# Patient Record
Sex: Male | Born: 1960 | Race: White | Hispanic: No | Marital: Single | Smoking: Never smoker
Health system: Southern US, Community
[De-identification: ages and names within clinical notes are randomized; demographics above are authoritative.]

## PROBLEM LIST (undated history)

## (undated) DIAGNOSIS — M109 Gout, unspecified: Secondary | ICD-10-CM

## (undated) DIAGNOSIS — I10 Essential (primary) hypertension: Secondary | ICD-10-CM

## (undated) DIAGNOSIS — E785 Hyperlipidemia, unspecified: Secondary | ICD-10-CM

## (undated) DIAGNOSIS — E119 Type 2 diabetes mellitus without complications: Secondary | ICD-10-CM

## (undated) DIAGNOSIS — K649 Unspecified hemorrhoids: Secondary | ICD-10-CM

## (undated) HISTORY — DX: Type 2 diabetes mellitus without complications: E11.9

## (undated) HISTORY — DX: Essential (primary) hypertension: I10

## (undated) HISTORY — DX: Hyperlipidemia, unspecified: E78.5

## (undated) HISTORY — DX: Unspecified hemorrhoids: K64.9

## (undated) HISTORY — PX: HIP ARTHRODESIS W/ ILIAC CREST BONE GRAFT: SHX1748

## (undated) HISTORY — PX: HERNIA REPAIR: SHX51

## (undated) HISTORY — PX: KNEE ARTHROPLASTY: SHX992

---

## 2004-06-02 ENCOUNTER — Ambulatory Visit: Payer: Self-pay | Admitting: Family Medicine

## 2004-07-21 ENCOUNTER — Ambulatory Visit: Payer: Self-pay | Admitting: Family Medicine

## 2004-07-28 ENCOUNTER — Ambulatory Visit: Payer: Self-pay | Admitting: Family Medicine

## 2004-09-29 ENCOUNTER — Ambulatory Visit: Payer: Self-pay | Admitting: Family Medicine

## 2005-05-30 ENCOUNTER — Ambulatory Visit: Payer: Self-pay | Admitting: Family Medicine

## 2007-08-20 ENCOUNTER — Ambulatory Visit: Payer: Self-pay | Admitting: Family Medicine

## 2007-08-20 DIAGNOSIS — Z8719 Personal history of other diseases of the digestive system: Secondary | ICD-10-CM

## 2007-08-20 DIAGNOSIS — K439 Ventral hernia without obstruction or gangrene: Secondary | ICD-10-CM | POA: Insufficient documentation

## 2007-08-20 LAB — CONVERTED CEMR LAB
AST: 22 units/L (ref 0–37)
Basophils Absolute: 0 10*3/uL (ref 0.0–0.1)
Basophils Relative: 0.6 % (ref 0.0–1.0)
Chloride: 104 meq/L (ref 96–112)
Creatinine, Ser: 0.9 mg/dL (ref 0.4–1.5)
Eosinophils Absolute: 0.2 10*3/uL (ref 0.0–0.7)
GFR calc non Af Amer: 97 mL/min
MCHC: 34.3 g/dL (ref 30.0–36.0)
MCV: 84.4 fL (ref 78.0–100.0)
Neutrophils Relative %: 55.2 % (ref 43.0–77.0)
Platelets: 195 10*3/uL (ref 150–400)
RDW: 12.7 % (ref 11.5–14.6)
Sodium: 142 meq/L (ref 135–145)
TSH: 3.12 microintl units/mL (ref 0.35–5.50)
Total Bilirubin: 0.8 mg/dL (ref 0.3–1.2)

## 2007-08-21 ENCOUNTER — Telehealth: Payer: Self-pay | Admitting: Family Medicine

## 2007-09-03 ENCOUNTER — Ambulatory Visit: Payer: Self-pay | Admitting: Family Medicine

## 2007-09-03 DIAGNOSIS — I10 Essential (primary) hypertension: Secondary | ICD-10-CM

## 2007-10-01 ENCOUNTER — Ambulatory Visit: Payer: Self-pay | Admitting: Family Medicine

## 2007-10-21 ENCOUNTER — Ambulatory Visit (HOSPITAL_COMMUNITY): Admission: RE | Admit: 2007-10-21 | Discharge: 2007-10-21 | Payer: Self-pay | Admitting: General Surgery

## 2007-10-23 ENCOUNTER — Inpatient Hospital Stay (HOSPITAL_COMMUNITY): Admission: RE | Admit: 2007-10-23 | Discharge: 2007-10-24 | Payer: Self-pay | Admitting: General Surgery

## 2008-09-15 ENCOUNTER — Ambulatory Visit: Payer: Self-pay | Admitting: Family Medicine

## 2008-09-15 LAB — CONVERTED CEMR LAB
Albumin: 4 g/dL (ref 3.5–5.2)
Alkaline Phosphatase: 79 units/L (ref 39–117)
Basophils Absolute: 0 10*3/uL (ref 0.0–0.1)
Bilirubin, Direct: 0.1 mg/dL (ref 0.0–0.3)
Blood in Urine, dipstick: NEGATIVE
CO2: 30 meq/L (ref 19–32)
Calcium: 9.2 mg/dL (ref 8.4–10.5)
Creatinine, Ser: 1.2 mg/dL (ref 0.4–1.5)
Eosinophils Absolute: 0.2 10*3/uL (ref 0.0–0.7)
Glucose, Bld: 101 mg/dL — ABNORMAL HIGH (ref 70–99)
Glucose, Urine, Semiquant: NEGATIVE
HDL: 26 mg/dL — ABNORMAL LOW (ref >39.00)
Ketones, urine, test strip: NEGATIVE
Lymphocytes Relative: 28.4 % (ref 12.0–46.0)
MCHC: 33.4 g/dL (ref 30.0–36.0)
Neutrophils Relative %: 57.5 % (ref 43.0–77.0)
Nitrite: NEGATIVE
RDW: 14.3 % (ref 11.5–14.6)
Specific Gravity, Urine: 1.02
Triglycerides: 133 mg/dL (ref 0.0–149.0)
pH: 7

## 2008-09-23 ENCOUNTER — Telehealth: Payer: Self-pay | Admitting: Family Medicine

## 2008-10-29 ENCOUNTER — Ambulatory Visit: Payer: Self-pay | Admitting: Family Medicine

## 2008-10-29 DIAGNOSIS — E786 Lipoprotein deficiency: Secondary | ICD-10-CM | POA: Insufficient documentation

## 2009-07-15 ENCOUNTER — Ambulatory Visit: Payer: Self-pay | Admitting: Family Medicine

## 2010-03-22 NOTE — Assessment & Plan Note (Signed)
Summary: RECTAL BLEEDING (HEMMORHOIDS?) // RS/PT RSC/CJR   Vital Signs:  Patient profile:   50 year old male Weight:      251 pounds Temp:     98.5 degrees F oral BP sitting:   120 / 80  (left arm) Cuff size:   regular  Vitals Entered By: Kathrynn Speed CMA (Jul 15, 2009 3:26 PM) CC: Rectal Bleeding / Hemmorhoid, Hypertension Management   CC:  Rectal Bleeding / Hemmorhoid and Hypertension Management.  History of Present Illness: Dylan Estrada is a 50 year old, married man nonsmoker comes in today for evaluation of rectal bleeding.  He said a year's history of off-and-on rectal bleeding.  It occurs once or twice monthly.  He had a colonoscopy a year ago, which was normal.  Hypertension History:      Positive major cardiovascular risk factors include male age 49 years old or older or older, hyperlipidemia, and hypertension.  Negative major cardiovascular risk factors include non-tobacco-user status.     Current Medications (verified): 1)  Zestoretic 10-12.5 Mg  Tabs (Lisinopril-Hydrochlorothiazide) .... Take 1 Tablet By Mouth Every Morning  Allergies (verified): No Known Drug Allergies  Social History: Reviewed history from 08/20/2007 and no changes required. Occupation:  Never Smoked Alcohol use-yes Married  Review of Systems      See HPI  Physical Exam  General:  Well-developed,well-nourished,in no acute distress; alert,appropriate and cooperative throughout examination Abdomen:  Bowel sounds positive,abdomen soft and non-tender without masses, organomegaly or hernias noted. Rectal:  No external abnormalities noted. Normal sphincter tone. No rectal masses or tenderness.guaiac-negative   Impression & Recommendations:  Problem # 1:  RECTAL BLEEDING, HX OF (ICD-V12.79) Assessment Deteriorated  Orders: Prescription Created Electronically [phone])  Complete Medication List: 1)  Zestoretic 10-12.5 Mg Tabs (Lisinopril-hydrochlorothiazide) .... Take 1 tablet by mouth every morning 2)   Anusol-hc 25 Mg Supp (Hydrocortisone acetate) .Marland Kitchen.. 1 rectally at bedtime x 12 nites  Hypertension Assessment/Plan:      The patient's hypertensive risk group is category B: At least one risk factor (excluding diabetes) with no target organ damage.  His calculated 10 year risk of coronary heart disease is 9 %.  Today's blood pressure is 120/80.    Patient Instructions: 1)  take a stool softener on a daily basis, soaked for 10 minutes at that time, insert a medicated suppository in y rectum x 12 nites. if after that you see any bleeding call GI [phone] for further evaluation Prescriptions: ANUSOL-HC 25 MG SUPP (HYDROCORTISONE ACETATE) 1 rectally at bedtime x 12 nites  #12 x 0   Entered and Authorized by:   Roderick Pee MD   Signed by:   Roderick Pee MD on 07/15/2009   Method used:   Electronically to        [facility]  [address] (retail)       [address]       [place], [place]  [address]       Ph: [phone] or [phone]       Fax: [phone]   RxID:   [id number]

## 2010-06-01 ENCOUNTER — Other Ambulatory Visit: Payer: Self-pay | Admitting: Family Medicine

## 2010-07-05 NOTE — Op Note (Signed)
NAME:  Dylan Estrada, Dylan Estrada                 ACCOUNT NO.:  [id number]   MEDICAL RECORD NO.:  [id number]          PATIENT TYPE:  INP   LOCATION:  1535                         FACILITY[facility]  [facility]   PHYSICIAN:  Juanetta Gosling, MDDATE OF BIRTH:  May 17, 1960   DATE OF PROCEDURE:  10/23/2007  DATE OF DISCHARGE:                               OPERATIVE REPORT   PREOPERATIVE DIAGNOSIS:  Umbilical hernia with diastasis recti   POSTOPERATIVE DIAGNOSIS:  Umbilical hernia with diastasis recti   PROCEDURE:  Laparoscopic umbilical hernia repair with 4 x 6 inch Proceed  mesh.   SURGEON:  Juanetta Gosling, MD.   ASSISTANT:  Ollen Gross. Vernell Morgans, M.D.   ANESTHESIA:  General.   FINDINGS:  About a 3.5 cm umbilical hernia with a large diastasis made  it difficult to determine exactly the size of the hernia due to his  weakness of his abdominal wall.  No specimen.   ESTIMATED BLOOD LOSS:  Minimal.   COMPLICATIONS:  None.   DRAINS:  None.   DISPOSITION:  To PACU in stable condition.   INDICATIONS:  Mr. Guster is a 50 year old obese male who was referred  for discomfort in his abdomen with a bulge noted in his umbilicus as  well as extending superior to that.  He had a colonoscopy that he  reports as normal.  On exam he had a reducible approximately 3 cm  umbilical hernia with diastasis recti above that.  He and I had a long  conversation about the differences between a diastasis recti and  umbilical hernia.  We discussed involvement of weight gain and lack of  exercise as the contributory factors.  I have recommended ventral hernia  repair which I think best would be performed laparoscopically given his  size and the size of his defect.   PROCEDURE IN DETAIL:  After informed consent was obtained the patient  was first administered 1 gram of Ancef.  He was then taken to the  operating room where he had sequential compression devices placed on his  legs throughout the procedure.  He then underwent  general endotracheal  anesthesia without complication.  His abdomen was then prepped and  draped in a sterile surgical fashion.  A piece of Ioban was then laid  over his abdomen as well.  After his stomach was evacuated and a Foley  catheter had been inserted a 5 mm incision was then made in his left  upper quadrant and an OptiView trocar was used to enter into his abdomen  which was without difficulty.  His abdomen was then insufflated to 15  mmHg pressure.  I then placed another 10 mm port in his left mid abdomen  under direct vision without complication and another 5 mm port in his  left lower quadrant without complication.  He had some fat that was  unable to be reduced prior to his surgery that I removed from his  hernia.  He had approximately about a 3 cm hernia and a large diastasis  above that.  I used a 4 x 6 piece of Proceed mesh and  inserted that  through the 10 mm trocar after placing four Novofil sutures in the  cardinal positions on the mesh.  This mesh was then laid out in the  correct orientation.  A 4 cm overlap was obtained in all directions.  The inferior suture was first approached.  A stab incision was then made  and this suture was then brought up using the Storz suture passer.  The  Storz suture passer did hit some of his intra-abdominal fat when it was  going in due to his space and lax abdominal wall but no injury was noted  after investigation.  This stitch was then coupled both ends.  The  superior stitch was then approached where a stab wound was made and then  both ends were brought up through the abdominal wall and this was noted  to be taut and pulled up.  The stitch at the 9 o'clock position and the  3 o'clock position were approached in the same fashion after stab wounds  were made in these positions.  The mesh was then pulled up with the  stitches.  The mesh appeared to be in very good position.  The sutures  were tied down.  The protractor was then used  to secure all the edges of  the mesh to the peritoneal wall and upon completion of this the mesh was  seated in good position.  The 10 mm trocar was then removed.  An  Endoclose and a zero Vicryl was used to close this trocar site.  Upon  completion of this the remaining trocar was removed under direct vision.  The abdomen was then desufflated and the last trocar removed.  The  wounds were closed with a 4-0 Monocryl and the stab wounds and all the  wounds were then closed with Dermabond.  His Foley was removed at the  end of the operation.  He tolerated this procedure well, was extubated  in the operating room and will be transferred to the recovery room in  stable condition.      Juanetta Gosling, MD  Electronically Signed     MCW/MEDQ  D:  10/23/2007  T:  10/23/2007  Job:  [id number]

## 2010-10-01 ENCOUNTER — Telehealth: Payer: Self-pay | Admitting: Family Medicine

## 2011-01-16 ENCOUNTER — Other Ambulatory Visit: Payer: Self-pay | Admitting: Family Medicine

## 2011-04-18 ENCOUNTER — Ambulatory Visit (INDEPENDENT_AMBULATORY_CARE_PROVIDER_SITE_OTHER): Payer: BC Managed Care – PPO | Admitting: Family Medicine

## 2011-04-18 ENCOUNTER — Encounter: Payer: Self-pay | Admitting: Family Medicine

## 2011-04-18 VITALS — BP 140/90 | Temp 98.6°F | Ht 68.25 in | Wt 254.0 lb

## 2011-04-18 DIAGNOSIS — K439 Ventral hernia without obstruction or gangrene: Secondary | ICD-10-CM

## 2011-04-18 DIAGNOSIS — I10 Essential (primary) hypertension: Secondary | ICD-10-CM

## 2011-04-18 DIAGNOSIS — M109 Gout, unspecified: Secondary | ICD-10-CM

## 2011-04-18 DIAGNOSIS — Z0000 Encounter for general adult medical examination without abnormal findings: Secondary | ICD-10-CM

## 2011-04-18 LAB — BASIC METABOLIC PANEL
CO2: 27 mEq/L (ref 19–32)
GFR: 72.3 mL/min (ref >60.00)
Glucose, Bld: 97 mg/dL (ref 70–99)
Potassium: 4.7 mEq/L (ref 3.5–5.1)
Sodium: 137 mEq/L (ref 135–145)

## 2011-04-18 LAB — CBC WITH DIFFERENTIAL/PLATELET
Basophils Absolute: 0 10*3/uL (ref 0.0–0.1)
Basophils Relative: 0.4 % (ref 0.0–3.0)
Eosinophils Relative: 1.7 % (ref 0.0–5.0)
HCT: 39.9 % (ref 39.0–52.0)
Hemoglobin: 13.2 g/dL (ref 13.0–17.0)
Lymphs Abs: 2 10*3/uL (ref 0.7–4.0)
Monocytes Relative: 8.9 % (ref 3.0–12.0)
Neutro Abs: 7.8 10*3/uL — ABNORMAL HIGH (ref 1.4–7.7)
RBC: 4.8 Mil/uL (ref 4.22–5.81)
RDW: 14.1 % (ref 11.5–14.6)

## 2011-04-18 LAB — HEPATIC FUNCTION PANEL
ALT: 23 U/L (ref 0–53)
AST: 17 U/L (ref 0–37)
Albumin: 3.9 g/dL (ref 3.5–5.2)
Total Protein: 7.2 g/dL (ref 6.0–8.3)

## 2011-04-18 LAB — POCT URINALYSIS DIPSTICK
Bilirubin, UA: NEGATIVE
Glucose, UA: NEGATIVE
Ketones, UA: NEGATIVE
Leukocytes, UA: NEGATIVE
Spec Grav, UA: 1.02

## 2011-04-18 LAB — LDL CHOLESTEROL, DIRECT: Direct LDL: 161 mg/dL

## 2011-04-18 LAB — TSH: TSH: 2.63 u[IU]/mL (ref 0.35–5.50)

## 2011-04-18 MED ORDER — ALLOPURINOL 300 MG PO TABS: 300.0000 mg | ORAL_TABLET | Freq: Every day | ORAL | Status: DC

## 2011-04-18 MED ORDER — PREDNISONE 20 MG PO TABS: ORAL_TABLET | ORAL | Status: DC

## 2011-04-18 MED ORDER — HYDROCODONE-ACETAMINOPHEN 7.5-750 MG PO TABS: 1.0000 | ORAL_TABLET | Freq: Three times a day (TID) | ORAL | Status: AC | PRN

## 2011-04-18 MED ORDER — LISINOPRIL-HYDROCHLOROTHIAZIDE 10-12.5 MG PO TABS: 1.0000 | ORAL_TABLET | Freq: Every day | ORAL | Status: DC

## 2011-04-18 NOTE — Progress Notes (Signed)
  Subjective:    Patient ID: Dylan Estrada, male    DOB: February 08, 1961, 51 y.o.   MRN: [id number]  HPI Dylan Estrada is a 51 year old married male nonsmoker who comes in today for evaluation of hypertension  He currently takes Zestoretic 10-12.5 daily for hypertension BP 140/90  Weight is up to 254 pounds 68-1/4 inches tall  For the past week he's had intermittent episodes of swelling and pain and redness in his feet. Today he comes in limping. Father and mother both have a history of gout  Tetanus booster 2006    Review of Systems  Constitutional: Negative.   HENT: Negative.   Eyes: Negative.   Respiratory: Negative.   Cardiovascular: Negative.   Gastrointestinal: Negative.   Genitourinary: Negative.   Musculoskeletal: Negative.   Skin: Negative.   Neurological: Negative.   Hematological: Negative.   Psychiatric/Behavioral: Negative.        Objective:   Physical Exam  Constitutional: He is oriented to person, place, and time. He appears well-developed and well-nourished.  HENT:  Head: Normocephalic and atraumatic.  Right Ear: External ear normal.  Left Ear: External ear normal.  Nose: Nose normal.  Mouth/Throat: Oropharynx is clear and moist.  Eyes: Conjunctivae and EOM are normal. Pupils are equal, round, and reactive to light.  Neck: Normal range of motion. Neck supple. No JVD present. No tracheal deviation present. No thyromegaly present.  Cardiovascular: Normal rate, regular rhythm, normal heart sounds and intact distal pulses.  Exam reveals no gallop and no friction rub.   No murmur heard. Pulmonary/Chest: Effort normal and breath sounds normal. No stridor. No respiratory distress. He has no wheezes. He has no rales. He exhibits no tenderness.  Abdominal: Soft. Bowel sounds are normal. He exhibits no distension and no mass. There is no tenderness. There is no rebound and no guarding.  Genitourinary: Rectum normal, prostate normal and penis normal. Guaiac negative stool. No  penile tenderness.  Musculoskeletal: Normal range of motion. He exhibits no edema and no tenderness.       Swelling and redness both feet  Lymphadenopathy:    He has no cervical adenopathy.  Neurological: He is alert and oriented to person, place, and time. He has normal reflexes. No cranial nerve deficit. He exhibits normal muscle tone.  Skin: Skin is warm and dry. No rash noted. No erythema. No pallor.  Psychiatric: He has a normal mood and affect. His behavior is normal. Judgment and thought content normal.          Assessment & Plan:  Healthy male  Hypertension continue current medication  Obesity diet exercise weight loss  Gout start allopurinol prednisone for acute relief

## 2011-04-18 NOTE — Patient Instructions (Signed)
Beginning the prednisone as directed  Vicodin one half to one tablet at bedtime when necessary for severe pain  Begin allopurinol 1 daily for ever  Continue your blood pressure medicine daily  Return in 6 weeks for followup  Spring is here he is time for a diet and exercise program

## 2011-05-30 ENCOUNTER — Ambulatory Visit: Payer: BC Managed Care – PPO | Admitting: Family Medicine

## 2011-06-20 ENCOUNTER — Ambulatory Visit (INDEPENDENT_AMBULATORY_CARE_PROVIDER_SITE_OTHER): Payer: BC Managed Care – PPO | Admitting: Family Medicine

## 2011-06-20 ENCOUNTER — Encounter: Payer: Self-pay | Admitting: Family Medicine

## 2011-06-20 VITALS — BP 120/90 | Temp 98.2°F | Wt 260.0 lb

## 2011-06-20 DIAGNOSIS — I10 Essential (primary) hypertension: Secondary | ICD-10-CM

## 2011-06-20 NOTE — Patient Instructions (Signed)
Continue your current blood pressure medication one pill daily  Salt free diet  Walk 20 minutes daily  Return in February 2014 for your annual exam sooner if your blood pressure becomes elevated  Check your blood pressure weekly at home

## 2011-06-20 NOTE — Progress Notes (Signed)
  Subjective:    Patient ID: Dylan Estrada, male    DOB: 10-Nov-1960, 51 y.o.   MRN: [id number]  HPI Dylan Estrada is a 51 year old male who comes in today for evaluation of hypertension  We saw him this winter with the physical examination blood pressure was elevated he is on Zestoretic 10-12.5 daily BP down to 120/90. He states that now his mother is dying of metastatic lung cancer. His father died last fall   Review of Systems General and cardiovascular review of systems otherwise negative    Objective:   Physical Exam  Well-developed well-nourished overweight male in no acute distress BP 120/90 right arm sitting position weight 260 pounds hypertension close to goal continue current therapy followup in 10 months BP check weekly return if blood pressure becomes elevated  Obesity diet exercise and weight loss       Assessment & Plan:  Hypertension continue current medication BP weekly  Obesity diet exercise and weight loss return in one year sooner if any problems

## 2012-04-15 ENCOUNTER — Other Ambulatory Visit: Payer: BC Managed Care – PPO

## 2012-04-22 ENCOUNTER — Encounter: Payer: BC Managed Care – PPO | Admitting: Family Medicine

## 2012-05-27 ENCOUNTER — Other Ambulatory Visit: Payer: Self-pay | Admitting: Family Medicine

## 2012-05-31 ENCOUNTER — Other Ambulatory Visit: Payer: Self-pay | Admitting: Family Medicine

## 2012-06-18 ENCOUNTER — Other Ambulatory Visit (INDEPENDENT_AMBULATORY_CARE_PROVIDER_SITE_OTHER): Payer: Self-pay

## 2012-06-18 DIAGNOSIS — Z0000 Encounter for general adult medical examination without abnormal findings: Secondary | ICD-10-CM

## 2012-06-18 LAB — CBC WITH DIFFERENTIAL/PLATELET
Basophils Absolute: 0 10*3/uL (ref 0.0–0.1)
Eosinophils Relative: 2.9 % (ref 0.0–5.0)
HCT: 38.2 % — ABNORMAL LOW (ref 39.0–52.0)
Hemoglobin: 13.3 g/dL (ref 13.0–17.0)
Lymphocytes Relative: 28.9 % (ref 12.0–46.0)
Lymphs Abs: 2.2 10*3/uL (ref 0.7–4.0)
Monocytes Relative: 10 % (ref 3.0–12.0)
Neutro Abs: 4.5 10*3/uL (ref 1.4–7.7)
WBC: 7.8 10*3/uL (ref 4.5–10.5)

## 2012-06-18 LAB — BASIC METABOLIC PANEL
CO2: 28 mEq/L (ref 19–32)
Calcium: 9.1 mg/dL (ref 8.4–10.5)
Chloride: 104 mEq/L (ref 96–112)
Creatinine, Ser: 1 mg/dL (ref 0.4–1.5)
Glucose, Bld: 102 mg/dL — ABNORMAL HIGH (ref 70–99)
Sodium: 139 mEq/L (ref 135–145)

## 2012-06-18 LAB — HEPATIC FUNCTION PANEL
ALT: 23 U/L (ref 0–53)
AST: 18 U/L (ref 0–37)
Albumin: 3.8 g/dL (ref 3.5–5.2)
Total Protein: 6.9 g/dL (ref 6.0–8.3)

## 2012-06-18 LAB — POCT URINALYSIS DIPSTICK
Blood, UA: NEGATIVE
Leukocytes, UA: NEGATIVE
Nitrite, UA: NEGATIVE
Urobilinogen, UA: 1
pH, UA: 8

## 2012-06-18 LAB — LIPID PANEL
HDL: 29.7 mg/dL — ABNORMAL LOW (ref >39.00)
LDL Cholesterol: 117 mg/dL — ABNORMAL HIGH (ref 0–99)
Total CHOL/HDL Ratio: 6
Triglycerides: 129 mg/dL (ref 0.0–149.0)

## 2012-06-25 ENCOUNTER — Encounter: Payer: Self-pay | Admitting: Family Medicine

## 2012-06-25 ENCOUNTER — Ambulatory Visit (INDEPENDENT_AMBULATORY_CARE_PROVIDER_SITE_OTHER): Payer: PRIVATE HEALTH INSURANCE | Admitting: Family Medicine

## 2012-06-25 ENCOUNTER — Other Ambulatory Visit: Payer: Self-pay | Admitting: Family Medicine

## 2012-06-25 VITALS — BP 110/78 | Temp 98.7°F | Ht 68.0 in | Wt 252.0 lb

## 2012-06-25 DIAGNOSIS — M109 Gout, unspecified: Secondary | ICD-10-CM

## 2012-06-25 DIAGNOSIS — I10 Essential (primary) hypertension: Secondary | ICD-10-CM

## 2012-06-25 DIAGNOSIS — E663 Overweight: Secondary | ICD-10-CM

## 2012-06-25 MED ORDER — ALLOPURINOL 300 MG PO TABS: ORAL_TABLET | ORAL | Status: DC

## 2012-06-25 MED ORDER — LISINOPRIL-HYDROCHLOROTHIAZIDE 10-12.5 MG PO TABS: ORAL_TABLET | ORAL | Status: DC

## 2012-06-25 NOTE — Progress Notes (Signed)
  Subjective:    Patient ID: Dylan Estrada, male    DOB: Oct 09, 1960, 52 y.o.   MRN: [id number]  HPI  Dylan Estrada is a 52 year old married male nonsmoker who comes in today for evaluation of hypertension and gout  He takes Zestoretic 10 days 2.5 daily for hypertension BP 110/78  He takes allopurinol 300 mg daily to prevent gout  His weight is 252 pounds 62 inches tall. We discussed diet exercise and weight loss. He says he travels a lot he's gone through 4 days per week on the road  He had a colonoscopy 2 years ago for evaluation of bright red rectal bleeding. Colonoscopy normal. He had some internal hemorrhoids  Review of Systems  Constitutional: Negative.   HENT: Negative.   Eyes: Negative.   Respiratory: Negative.   Cardiovascular: Negative.   Gastrointestinal: Negative.   Genitourinary: Negative.   Musculoskeletal: Negative.   Skin: Negative.   Neurological: Negative.   Psychiatric/Behavioral: Negative.        Objective:   Physical Exam  Constitutional: He is oriented to person, place, and time. He appears well-developed and well-nourished.  HENT:  Head: Normocephalic and atraumatic.  Right Ear: External ear normal.  Left Ear: External ear normal.  Nose: Nose normal.  Mouth/Throat: Oropharynx is clear and moist.  Eyes: Conjunctivae and EOM are normal. Pupils are equal, round, and reactive to light.  Neck: Normal range of motion. Neck supple. No JVD present. No tracheal deviation present. No thyromegaly present.  Cardiovascular: Normal rate, regular rhythm, normal heart sounds and intact distal pulses.  Exam reveals no gallop and no friction rub.   No murmur heard. Pulmonary/Chest: Effort normal and breath sounds normal. No stridor. No respiratory distress. He has no wheezes. He has no rales. He exhibits no tenderness.  Abdominal: Soft. Bowel sounds are normal. He exhibits no distension and no mass. There is no tenderness. There is no rebound and no guarding.  Colonoscopy 2  years ago negative done before each 51 because of history rectal bleeding  Genitourinary: Rectum normal, prostate normal and penis normal. Guaiac negative stool. No penile tenderness.  Musculoskeletal: Normal range of motion. He exhibits no edema and no tenderness.  Lymphadenopathy:    He has no cervical adenopathy.  Neurological: He is alert and oriented to person, place, and time. He has normal reflexes. No cranial nerve deficit. He exhibits normal muscle tone.  Skin: Skin is warm and dry. No rash noted. No erythema. No pallor.  Psychiatric: He has a normal mood and affect. His behavior is normal. Judgment and thought content normal.          Assessment & Plan:  Healthy male  History of gout continue allopurinol one daily  Hypertension continue Zestoretic one daily at an aspirin tablet  Overweight will have him consult with the [facility] at [facility]

## 2012-06-25 NOTE — Patient Instructions (Addendum)
Continue the allopurinol and Zestoretic one of each daily  At an aspirin tablet daily  Up a consult request for you and your wife to go to the nutrition center to talk about nutrition and weight loss  Return in one year for general physical examination sooner if any problems

## 2012-12-01 ENCOUNTER — Emergency Department (HOSPITAL_COMMUNITY)
Admission: EM | Admit: 2012-12-01 | Discharge: 2012-12-01 | Disposition: A | Discharge status: Home / Self Care | Payer: PRIVATE HEALTH INSURANCE | Source: Home / Self Care | Attending: Family Medicine | Admitting: Family Medicine

## 2012-12-01 ENCOUNTER — Encounter (HOSPITAL_COMMUNITY): Payer: Self-pay | Admitting: Emergency Medicine

## 2012-12-01 ENCOUNTER — Emergency Department (INDEPENDENT_AMBULATORY_CARE_PROVIDER_SITE_OTHER): Payer: PRIVATE HEALTH INSURANCE

## 2012-12-01 DIAGNOSIS — M659 Synovitis and tenosynovitis, unspecified: Secondary | ICD-10-CM

## 2012-12-01 DIAGNOSIS — M7750 Other enthesopathy of unspecified foot: Secondary | ICD-10-CM

## 2012-12-01 HISTORY — DX: Gout, unspecified: M10.9

## 2012-12-01 MED ORDER — NAPROXEN 500 MG PO TABS: 500.0000 mg | ORAL_TABLET | Freq: Two times a day (BID) | ORAL | Status: DC

## 2012-12-01 NOTE — ED Provider Notes (Signed)
CSN: [id number]     Arrival date & time 12/01/12  1314 History   First MD Initiated Contact with Patient 12/01/12 1509     Chief Complaint  Patient presents with  . Foot Pain   (Consider location/radiation/quality/duration/timing/severity/associated sxs/prior Treatment) HPI Comments: Pt denies injury  Patient is a 52 y.o. male presenting with lower extremity pain. The history is provided by the patient.  Foot Pain This is a new problem. Episode onset: 3 weeks ago. The problem occurs constantly. The problem has not changed since onset.The symptoms are aggravated by walking and standing. The symptoms are relieved by rest. He has tried acetaminophen for the symptoms. The treatment provided no relief.    Past Medical History  Diagnosis Date  . Hyperlipidemia   . Hypertension   . Gout    Past Surgical History  Procedure Laterality Date  . Hip arthrodesis w/ iliac crest bone graft     Family History  Problem Relation Age of Onset  . Heart disease Other     male  . Diabetes Other   . Hyperlipidemia Other   . Hypertension Other    History  Substance Use Topics  . Smoking status: Never Smoker   . Smokeless tobacco: Not on file  . Alcohol Use: No    Review of Systems  Constitutional: Negative for fever and chills.  Musculoskeletal:       Foot pain  Skin: Negative for color change and wound.    Allergies  Review of patient's allergies indicates not on file.  Home Medications   Current Outpatient Rx  Name  Route  Sig  Dispense  Refill  . allopurinol (ZYLOPRIM) 300 MG tablet      TAKE ONE TABLET BY MOUTH DAILY   100 tablet   3   . lisinopril-hydrochlorothiazide (PRINZIDE,ZESTORETIC) 10-12.5 MG per tablet      TAKE ONE TABLET BY MOUTH EVERY DAY   100 tablet   3   . naproxen (NAPROSYN) 500 MG tablet   Oral   Take 1 tablet (500 mg total) by mouth 2 (two) times daily.   20 tablet   0    BP 126/76  Pulse 58  Temp(Src) 98.4 F (36.9 C) (Oral)  Resp 16  SpO2  98% Physical Exam  Constitutional: He appears well-developed and well-nourished. No distress.  Musculoskeletal:       Right foot: He exhibits tenderness, bony tenderness and swelling. He exhibits normal range of motion.       Feet:  Skin: Skin is warm, dry and intact. No erythema.    ED Course  Procedures (including critical care time) Labs Review Labs Reviewed - No data to display Imaging Review Dg Foot Complete Right  12/01/2012   CLINICAL DATA:  Right foot pain.  EXAM: RIGHT FOOT COMPLETE - 3+ VIEW  COMPARISON:  None.  FINDINGS: There is no evidence of fracture or dislocation. There is no evidence of arthropathy or other focal bone abnormality. Soft tissues are unremarkable.  IMPRESSION: Normal right foot.   Electronically Signed   By: Roque Lias M.D.   On: 12/01/2012 14:36    EKG Interpretation     Ventricular Rate:    PR Interval:    QRS Duration:   QT Interval:    QTC Calculation:   R Axis:     Text Interpretation:              MDM   1. Tendonitis of foot    Given post op shoe.  Rx naproxen 500mg  BID #20. Pt to f/u with podiatrist.     Cathlyn Parsons, NP 12/01/12 1515

## 2012-12-01 NOTE — ED Notes (Signed)
Pt reports he has right foot pain for about 3 weeks. Has been taking aspirin to alleviate pain. Feels pain mostly when he walks and puts pressure on that area. Pt is alert and oriented in no acute distress.

## 2012-12-03 NOTE — ED Provider Notes (Signed)
Medical screening examination/treatment/procedure(s) were performed by resident physician or non-physician practitioner and as supervising physician I was immediately available for consultation/collaboration.   Atalia Litzinger DOUGLAS MD.   Dillie Burandt D Akiva Josey, MD 12/03/12 1412 

## 2013-08-02 ENCOUNTER — Other Ambulatory Visit: Payer: Self-pay | Admitting: Family Medicine

## 2013-08-27 ENCOUNTER — Other Ambulatory Visit: Payer: Self-pay | Admitting: Family Medicine

## 2013-08-30 ENCOUNTER — Other Ambulatory Visit: Payer: Self-pay | Admitting: Family Medicine

## 2013-09-01 ENCOUNTER — Other Ambulatory Visit: Payer: Self-pay | Admitting: Family Medicine

## 2013-12-18 ENCOUNTER — Other Ambulatory Visit (INDEPENDENT_AMBULATORY_CARE_PROVIDER_SITE_OTHER): Payer: PRIVATE HEALTH INSURANCE

## 2013-12-18 DIAGNOSIS — E785 Hyperlipidemia, unspecified: Secondary | ICD-10-CM

## 2013-12-18 DIAGNOSIS — Z0000 Encounter for general adult medical examination without abnormal findings: Secondary | ICD-10-CM

## 2013-12-18 LAB — HEPATIC FUNCTION PANEL
ALBUMIN: 3.5 g/dL (ref 3.5–5.2)
ALT: 33 U/L (ref 0–53)
AST: 23 U/L (ref 0–37)
Alkaline Phosphatase: 84 U/L (ref 39–117)
Bilirubin, Direct: 0.1 mg/dL (ref 0.0–0.3)
TOTAL PROTEIN: 7.3 g/dL (ref 6.0–8.3)
Total Bilirubin: 0.7 mg/dL (ref 0.2–1.2)

## 2013-12-18 LAB — LDL CHOLESTEROL, DIRECT: LDL DIRECT: 129.2 mg/dL

## 2013-12-18 LAB — BASIC METABOLIC PANEL
BUN: 14 mg/dL (ref 6–23)
CALCIUM: 9.4 mg/dL (ref 8.4–10.5)
CO2: 29 meq/L (ref 19–32)
CREATININE: 1 mg/dL (ref 0.4–1.5)
Chloride: 102 mEq/L (ref 96–112)
GFR: 83.21 mL/min (ref >60.00)
Glucose, Bld: 108 mg/dL — ABNORMAL HIGH (ref 70–99)
Potassium: 4.7 mEq/L (ref 3.5–5.1)
Sodium: 137 mEq/L (ref 135–145)

## 2013-12-18 LAB — CBC WITH DIFFERENTIAL/PLATELET
BASOS ABS: 0 10*3/uL (ref 0.0–0.1)
BASOS PCT: 0.2 % (ref 0.0–3.0)
EOS ABS: 0.3 10*3/uL (ref 0.0–0.7)
Eosinophils Relative: 3.3 % (ref 0.0–5.0)
HCT: 42.4 % (ref 39.0–52.0)
Hemoglobin: 14 g/dL (ref 13.0–17.0)
LYMPHS PCT: 28.7 % (ref 12.0–46.0)
Lymphs Abs: 2.3 10*3/uL (ref 0.7–4.0)
MCHC: 33.1 g/dL (ref 30.0–36.0)
MCV: 89 fl (ref 78.0–100.0)
MONO ABS: 0.9 10*3/uL (ref 0.1–1.0)
Monocytes Relative: 10.9 % (ref 3.0–12.0)
NEUTROS PCT: 56.9 % (ref 43.0–77.0)
Neutro Abs: 4.5 10*3/uL (ref 1.4–7.7)
Platelets: 230 10*3/uL (ref 150.0–400.0)
RBC: 4.76 Mil/uL (ref 4.22–5.81)
RDW: 14.4 % (ref 11.5–15.5)
WBC: 7.9 10*3/uL (ref 4.0–10.5)

## 2013-12-18 LAB — LIPID PANEL
Cholesterol: 193 mg/dL (ref 0–200)
HDL: 30.7 mg/dL — AB (ref >39.00)
NONHDL: 162.3
Total CHOL/HDL Ratio: 6
Triglycerides: 236 mg/dL — ABNORMAL HIGH (ref 0.0–149.0)
VLDL: 47.2 mg/dL — ABNORMAL HIGH (ref 0.0–40.0)

## 2013-12-18 LAB — POCT URINALYSIS DIPSTICK
Bilirubin, UA: NEGATIVE
Glucose, UA: NEGATIVE
Ketones, UA: NEGATIVE
Leukocytes, UA: NEGATIVE
NITRITE UA: NEGATIVE
PH UA: 7
RBC UA: NEGATIVE
Spec Grav, UA: 1.015
Urobilinogen, UA: 1

## 2013-12-18 LAB — TSH: TSH: 3.43 u[IU]/mL (ref 0.35–4.50)

## 2013-12-18 LAB — PSA: PSA: 0.42 ng/mL (ref 0.10–4.00)

## 2013-12-23 ENCOUNTER — Encounter: Payer: Self-pay | Admitting: Family Medicine

## 2013-12-23 ENCOUNTER — Ambulatory Visit (INDEPENDENT_AMBULATORY_CARE_PROVIDER_SITE_OTHER): Payer: PRIVATE HEALTH INSURANCE | Admitting: Family Medicine

## 2013-12-23 VITALS — BP 120/80 | Temp 98.7°F | Ht 68.0 in | Wt 259.0 lb

## 2013-12-23 DIAGNOSIS — M10071 Idiopathic gout, right ankle and foot: Secondary | ICD-10-CM

## 2013-12-23 DIAGNOSIS — I10 Essential (primary) hypertension: Secondary | ICD-10-CM

## 2013-12-23 MED ORDER — ALLOPURINOL 300 MG PO TABS: ORAL_TABLET | ORAL | Status: DC

## 2013-12-23 MED ORDER — LISINOPRIL-HYDROCHLOROTHIAZIDE 10-12.5 MG PO TABS: ORAL_TABLET | ORAL | Status: DC

## 2013-12-23 NOTE — Patient Instructions (Signed)
Continue your current medications  I would strongly suggest you join  weight watchers

## 2013-12-23 NOTE — Progress Notes (Signed)
Pre visit review using our clinic review tool, if applicable. No additional management support is needed unless otherwise documented below in the visit note. 

## 2013-12-23 NOTE — Progress Notes (Signed)
   Subjective:    Patient ID: Dylan Estrada, male    DOB: 10-31-1960, 53 y.o.   MRN: [id number]  HPI Dylan Estrada is a 53 year old male,,,,,,,, I first saw him 41 years ago,,,,,, who comes in today for general physical examination because of a history of hypertension and gout  He takes lisinopril 10-12 0.5 daily for hypertension BP 120/80  He takes allopurinol 3 mg daily to prevent gout  Weight is up to 259 pounds height 68 inches  He gets routine eye care, dental care, first colonoscopy at age 28 years ago normal  Vaccinations updated by Apolonio Schneiders   Review of Systems  Constitutional: Negative.   HENT: Negative.   Eyes: Negative.   Respiratory: Negative.   Cardiovascular: Negative.   Gastrointestinal: Negative.   Endocrine: Negative.   Genitourinary: Negative.   Musculoskeletal: Negative.   Skin: Negative.   Allergic/Immunologic: Negative.   Neurological: Negative.   Hematological: Negative.   Psychiatric/Behavioral: Negative.        Objective:   Physical Exam  Constitutional: He is oriented to person, place, and time. He appears well-developed and well-nourished.  HENT:  Head: Normocephalic and atraumatic.  Right Ear: External ear normal.  Left Ear: External ear normal.  Nose: Nose normal.  Mouth/Throat: Oropharynx is clear and moist.  Eyes: Conjunctivae and EOM are normal. Pupils are equal, round, and reactive to light.  Neck: Normal range of motion. Neck supple. No JVD present. No tracheal deviation present. No thyromegaly present.  Cardiovascular: Normal rate, regular rhythm, normal heart sounds and intact distal pulses.  Exam reveals no gallop and no friction rub.   No murmur heard. No carotid nor aortic bruits or for pulses 2+ and symmetrical  Pulmonary/Chest: Effort normal and breath sounds normal. No stridor. No respiratory distress. He has no wheezes. He has no rales. He exhibits no tenderness.  Abdominal: Soft. Bowel sounds are normal. He exhibits no distension and no  mass. There is no tenderness. There is no rebound and no guarding.  Massive panniculus and ventral hernia  Genitourinary: Rectum normal, prostate normal and penis normal. Guaiac negative stool. No penile tenderness.  Musculoskeletal: Normal range of motion. He exhibits no edema or tenderness.  Lymphadenopathy:    He has no cervical adenopathy.  Neurological: He is alert and oriented to person, place, and time. He has normal reflexes. No cranial nerve deficit. He exhibits normal muscle tone.  Skin: Skin is warm and dry. No rash noted. No erythema. No pallor.  Psychiatric: He has a normal mood and affect. His behavior is normal. Judgment and thought content normal.          Assessment & Plan:  Hypertension ago continue current therapy  Gout asymptomatic now. All 300 mg daily continue that  Obesity weight 259 pounds recommend Weight Watchers

## 2013-12-24 ENCOUNTER — Telehealth: Payer: Self-pay | Admitting: Family Medicine

## 2013-12-24 NOTE — Telephone Encounter (Signed)
emmi mailed  °

## 2014-12-07 ENCOUNTER — Encounter: Payer: Self-pay | Admitting: Family Medicine

## 2014-12-07 ENCOUNTER — Ambulatory Visit (INDEPENDENT_AMBULATORY_CARE_PROVIDER_SITE_OTHER): Payer: PRIVATE HEALTH INSURANCE | Admitting: Family Medicine

## 2014-12-07 VITALS — BP 110/80 | Temp 98.2°F | Wt 257.0 lb

## 2014-12-07 DIAGNOSIS — E669 Obesity, unspecified: Secondary | ICD-10-CM | POA: Diagnosis not present

## 2014-12-07 DIAGNOSIS — I10 Essential (primary) hypertension: Secondary | ICD-10-CM | POA: Diagnosis not present

## 2014-12-07 DIAGNOSIS — Z23 Encounter for immunization: Secondary | ICD-10-CM | POA: Diagnosis not present

## 2014-12-07 DIAGNOSIS — M10071 Idiopathic gout, right ankle and foot: Secondary | ICD-10-CM

## 2014-12-07 MED ORDER — ALLOPURINOL 300 MG PO TABS: ORAL_TABLET | ORAL | Status: DC

## 2014-12-07 MED ORDER — LOSARTAN POTASSIUM-HCTZ 50-12.5 MG PO TABS: ORAL_TABLET | ORAL | Status: DC

## 2014-12-07 NOTE — Patient Instructions (Signed)
Stop the lisinopril  Start Hyzaar 50-12 0.5.......... one half tab daily in the morning for high blood pressure  Continue the allopurinol one daily  Set up a time sometime in the next 3 months for general physical examination  Fasting labs one week prior

## 2014-12-07 NOTE — Progress Notes (Signed)
   Subjective:    Patient ID: Dylan Estrada, male    DOB: 07-Aug-1960, 54 y.o.   MRN: [id number]  HPI Dylan Estrada is a 54 year old male nonsmoker who comes in today for follow-up of hypertension, gout and a new prominence of a cough  He's had a cough now for couple months. It's not keeping him awake at night but will go away. It's probably due to the ACE inhibitor. Will change medication  Dylan Estrada feels well  Weight is up to 259 pounds. Discussed diet exercise and weight loss again  He's due for a tetanus booster and flu shot which will be given today. Also because of obesity obese screen for diabetes   Review of Systems Review of systems otherwise negative    Objective:   Physical Exam Well-developed well-nourished male no acute distress vital signs stable is afebrile BP today 110/80       Assessment & Plan:  Obesity........ again discussed route exercise and weight loss check fasting blood sugar and A1c  . Hypertension at goal....Marland KitchenMarland Kitchen but with cough secondary to medication will change to Cozaar  History of gout....... continue allopurinol daily

## 2014-12-07 NOTE — Progress Notes (Signed)
Pre visit review using our clinic review tool, if applicable. No additional management support is needed unless otherwise documented below in the visit note. 

## 2015-03-24 ENCOUNTER — Other Ambulatory Visit (INDEPENDENT_AMBULATORY_CARE_PROVIDER_SITE_OTHER): Payer: PRIVATE HEALTH INSURANCE

## 2015-03-24 DIAGNOSIS — Z0000 Encounter for general adult medical examination without abnormal findings: Secondary | ICD-10-CM | POA: Diagnosis not present

## 2015-03-24 LAB — CBC WITH DIFFERENTIAL/PLATELET
BASOS ABS: 0 10*3/uL (ref 0.0–0.1)
BASOS PCT: 0.4 % (ref 0.0–3.0)
EOS ABS: 0.2 10*3/uL (ref 0.0–0.7)
Eosinophils Relative: 2.6 % (ref 0.0–5.0)
HCT: 44 % (ref 39.0–52.0)
Hemoglobin: 14.5 g/dL (ref 13.0–17.0)
Lymphocytes Relative: 25.9 % (ref 12.0–46.0)
Lymphs Abs: 2.4 10*3/uL (ref 0.7–4.0)
MCHC: 32.9 g/dL (ref 30.0–36.0)
MCV: 88.9 fl (ref 78.0–100.0)
MONO ABS: 0.9 10*3/uL (ref 0.1–1.0)
Monocytes Relative: 9.3 % (ref 3.0–12.0)
NEUTROS ABS: 5.7 10*3/uL (ref 1.4–7.7)
NEUTROS PCT: 61.8 % (ref 43.0–77.0)
PLATELETS: 210 10*3/uL (ref 150.0–400.0)
RBC: 4.95 Mil/uL (ref 4.22–5.81)
RDW: 14 % (ref 11.5–15.5)
WBC: 9.3 10*3/uL (ref 4.0–10.5)

## 2015-03-24 LAB — POC URINALSYSI DIPSTICK (AUTOMATED)
Bilirubin, UA: NEGATIVE
Blood, UA: NEGATIVE
Glucose, UA: NEGATIVE
KETONES UA: NEGATIVE
LEUKOCYTES UA: NEGATIVE
NITRITE UA: NEGATIVE
PH UA: 6
PROTEIN UA: NEGATIVE
Spec Grav, UA: 1.025
UROBILINOGEN UA: 0.2

## 2015-03-24 LAB — BASIC METABOLIC PANEL
BUN: 19 mg/dL (ref 6–23)
CALCIUM: 9.7 mg/dL (ref 8.4–10.5)
CHLORIDE: 103 meq/L (ref 96–112)
CO2: 29 meq/L (ref 19–32)
CREATININE: 1.08 mg/dL (ref 0.40–1.50)
GFR: 75.78 mL/min (ref >60.00)
Glucose, Bld: 141 mg/dL — ABNORMAL HIGH (ref 70–99)
Potassium: 4.6 mEq/L (ref 3.5–5.1)
Sodium: 142 mEq/L (ref 135–145)

## 2015-03-24 LAB — LIPID PANEL
CHOL/HDL RATIO: 5
Cholesterol: 199 mg/dL (ref 0–200)
HDL: 38.1 mg/dL — AB (ref >39.00)
LDL CALC: 133 mg/dL — AB (ref 0–99)
NONHDL: 161.36
Triglycerides: 143 mg/dL (ref 0.0–149.0)
VLDL: 28.6 mg/dL (ref 0.0–40.0)

## 2015-03-24 LAB — HEPATIC FUNCTION PANEL
ALK PHOS: 95 U/L (ref 39–117)
ALT: 39 U/L (ref 0–53)
AST: 25 U/L (ref 0–37)
Albumin: 4.3 g/dL (ref 3.5–5.2)
BILIRUBIN DIRECT: 0.1 mg/dL (ref 0.0–0.3)
BILIRUBIN TOTAL: 0.6 mg/dL (ref 0.2–1.2)
Total Protein: 7.2 g/dL (ref 6.0–8.3)

## 2015-03-24 LAB — TSH: TSH: 3.31 u[IU]/mL (ref 0.35–4.50)

## 2015-03-24 LAB — PSA: PSA: 0.22 ng/mL (ref 0.10–4.00)

## 2015-03-29 ENCOUNTER — Encounter: Payer: Self-pay | Admitting: Family Medicine

## 2015-03-29 ENCOUNTER — Ambulatory Visit (INDEPENDENT_AMBULATORY_CARE_PROVIDER_SITE_OTHER): Payer: PRIVATE HEALTH INSURANCE | Admitting: Family Medicine

## 2015-03-29 VITALS — BP 130/90 | Temp 98.5°F | Ht 68.0 in | Wt 265.0 lb

## 2015-03-29 DIAGNOSIS — E119 Type 2 diabetes mellitus without complications: Secondary | ICD-10-CM | POA: Diagnosis not present

## 2015-03-29 DIAGNOSIS — I10 Essential (primary) hypertension: Secondary | ICD-10-CM

## 2015-03-29 DIAGNOSIS — Z0000 Encounter for general adult medical examination without abnormal findings: Secondary | ICD-10-CM | POA: Diagnosis not present

## 2015-03-29 DIAGNOSIS — M10071 Idiopathic gout, right ankle and foot: Secondary | ICD-10-CM

## 2015-03-29 DIAGNOSIS — E669 Obesity, unspecified: Secondary | ICD-10-CM | POA: Insufficient documentation

## 2015-03-29 LAB — HEMOGLOBIN A1C: HEMOGLOBIN A1C: 6.8 % — AB (ref 4.6–6.5)

## 2015-03-29 LAB — MICROALBUMIN / CREATININE URINE RATIO
Creatinine,U: 45.1 mg/dL
Microalb Creat Ratio: 1.6 mg/g (ref 0.0–30.0)
Microalb, Ur: <0.7 mg/dL (ref 0.0–1.9)

## 2015-03-29 MED ORDER — ALLOPURINOL 300 MG PO TABS: ORAL_TABLET | ORAL | Status: DC

## 2015-03-29 MED ORDER — LOSARTAN POTASSIUM-HCTZ 50-12.5 MG PO TABS: ORAL_TABLET | ORAL | Status: DC

## 2015-03-29 NOTE — Progress Notes (Signed)
   Subjective:    Patient ID: Dylan Estrada, male    DOB: 1960/06/26, 55 y.o.   MRN: [id number]  HPI Dylan Estrada is a 55 year old male nonsmoker who comes in today for general physical examination because of a history of underlying hypertension and gout  He takes Hyzaar 50-12.5 one half tab daily for hypertension BP at home 130/80  He takes allopurinol 300 mg daily to prevent gout  He takes with routine eye care, dental care, colonoscopy 2013 at [facility] was normal.  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 His nutrition is poor weight was 257 pounds last year now has to 165. I discussed with him a nutrition consult he agrees    Review of Systems  Constitutional: Negative.   HENT: Negative.   Eyes: Negative.   Respiratory: Negative.   Cardiovascular: Negative.   Gastrointestinal: Negative.   Endocrine: Negative.   Genitourinary: Negative.   Musculoskeletal: Negative.   Skin: Negative.   Allergic/Immunologic: Negative.   Neurological: Negative.   Hematological: Negative.   Psychiatric/Behavioral: Negative.        Objective:   Physical Exam  Constitutional: He is oriented to person, place, and time. He appears well-developed and well-nourished.  HENT:  Head: Normocephalic and atraumatic.  Right Ear: External ear normal.  Left Ear: External ear normal.  Nose: Nose normal.  Mouth/Throat: Oropharynx is clear and moist.  Eyes: Conjunctivae and EOM are normal. Pupils are equal, round, and reactive to light.  Neck: Normal range of motion. Neck supple. No JVD present. No tracheal deviation present. No thyromegaly present.  Cardiovascular: Normal rate, regular rhythm, normal heart sounds and intact distal pulses.  Exam reveals no gallop and no friction rub.   No murmur heard. No carotid nor aortic bruits peripheral pulses 1+ and symmetrical  Pulmonary/Chest: Effort normal and breath  sounds normal. No stridor. No respiratory distress. He has no wheezes. He has no rales. He exhibits no tenderness.  Abdominal: Soft. Bowel sounds are normal. He exhibits no distension and no mass. There is no tenderness. There is no rebound and no guarding.  Genitourinary: Rectum normal, prostate normal and penis normal. Guaiac negative stool. No penile tenderness.  Musculoskeletal: Normal range of motion. He exhibits no edema or tenderness.  Lymphadenopathy:    He has no cervical adenopathy.  Neurological: He is alert and oriented to person, place, and time. He has normal reflexes. No cranial nerve deficit. He exhibits normal muscle tone.  Skin: Skin is warm and dry. No rash noted. No erythema. No pallor.  Psychiatric: He has a normal mood and affect. His behavior is normal. Judgment and thought content normal.  Nursing note and vitals reviewed.         Assessment & Plan:  Obesity class II.........Marland Kitchen recommend nutrition consult diet exercise and weight loss  Hypertension at goal........... continue current therapy  History of gout......... continue allopurinol.

## 2015-03-29 NOTE — Progress Notes (Signed)
Pre visit review using our clinic review tool, if applicable. No additional management support is needed unless otherwise documented below in the visit note. 

## 2015-03-29 NOTE — Patient Instructions (Addendum)
Continue current blood pressure medication  Continue the allopurinol one daily  Nutrition consult for weight loss  A1c today.............. this will tell you what your blood sugars been averaging for the last 90 days...Marland KitchenMarland KitchenMarland Kitchen we will call you the report  Return in 3 months for follow-up,,,,,,,,,,,,,, [name] or Almyra Free are 2 new adult nurse practitioner's or Dr. Martinique  Nonfasting labs one week prior  Complete Sugar free diet

## 2015-03-31 ENCOUNTER — Telehealth: Payer: Self-pay | Admitting: Family Medicine

## 2015-03-31 NOTE — Telephone Encounter (Signed)
Left detailed message on machine for patient per patient request. 

## 2015-03-31 NOTE — Telephone Encounter (Signed)
Patient returned CMA's telephone call.  He stated that he's out of town and does not have good reception, so please return the call and leave a message on his phone.

## 2015-05-21 ENCOUNTER — Encounter: Payer: PRIVATE HEALTH INSURANCE | Attending: Family Medicine | Admitting: Dietician

## 2015-05-21 ENCOUNTER — Encounter: Payer: Self-pay | Admitting: Dietician

## 2015-05-21 VITALS — Ht 68.0 in | Wt 256.0 lb

## 2015-05-21 DIAGNOSIS — E669 Obesity, unspecified: Secondary | ICD-10-CM | POA: Insufficient documentation

## 2015-05-21 DIAGNOSIS — R739 Hyperglycemia, unspecified: Secondary | ICD-10-CM

## 2015-05-21 NOTE — Progress Notes (Signed)
  Medical Nutrition Therapy:  Appt start time: [id number] end time:  [id number].   Assessment:  Primary concerns today: Patient is here today alone.  His HgbA1C was 6.8% 03/29/15 and he would like to lose weight .  Hx includes Hyperlipidemia, HTN, gout.   Weight Hx:  Highest adult weight:  265 lbs 03/29/15  Today 256 lbs today 249 lbs at home a couple of weeks ago without clothes Lowest adult weight:  210 lbs   Patient lives with his wife.  Wife does the shopping and cooking.  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.  He has been traveling a lot and is gone a week at a time.  His wife has started prepping some baked food that he heats in the microwave when he travels.  Because of these changes, he has lost 7 lbs in the last 6 weeks.  Preferred Learning Style:   No preference indicated   Learning Readiness:   Ready  Change in progress   MEDICATIONS: see list   DIETARY INTAKE: He has recently changed to sugar free beverages. Usual eating pattern includes 1-3 meals and 1-3 snacks per day.  24-hr recall:  B (9 AM): apple or banana and granola bar (previously skipped) Snk ( AM): none  L ( PM): most of the time skips (some McDonald's or subway) Snk ( PM): occasional Celery and peanut butter or carrots D (5:30-6 PM): from home:  Chicken or roast, vegetables, baked sweet potato Snk ( PM): occasional pretzles Beverages: water, unsweetened tea, diet soda, occasional coffee with cream and sugar, Diet tea  Usual physical activity: none  Estimated energy needs: 1800 calories 200 g carbohydrates 113 g protein 60 g fat  Progress Towards Goal(s):  In progress.   Nutritional Diagnosis:  NB-1.1 Food and nutrition-related knowledge deficit As related to balance of carbohydrate, protein, and fat.  As evidenced by diet hx and patient report.    Intervention:  Nutrition counseling and diabetes education initiated. Discussed Carb Counting by food group as method of portion control,  reading food labels, and benefits of increased activity. Also discussed basic physiology of Diabetes, target BG ranges pre and post meals, and A1c. Discussed mindful eating, portion control, and exercise importance for weight loss.  Plan:  Aim for 3 Carb Choices per meal (45 grams) +/- 1 either way  Aim for 0-1 Carbs per snack if hungry  Include protein in moderation with your meals and snacks Consider reading food labels for Total Carbohydrate and Fat Grams of foods Consider  increasing your activity level by walking for 30 minutes daily as tolerated  Teaching Method Utilized:  Visual Auditory Hands on  Handouts given during visit include: Living Well with Diabetes Food Label handouts Meal Plan Card  HgbA1C sheet  Breakfast ideas  Label reading  Snack list  Barriers to learning/adherence to lifestyle change: a lot of traveling for work  Demonstrated degree of understanding via:  Teach Back   Monitoring/Evaluation:  Dietary intake, exercise, label reading, and body weight prn.

## 2015-05-21 NOTE — Patient Instructions (Signed)
Plan:  Aim for 3 Carb Choices per meal (45 grams) +/- 1 either way  Aim for 0-1 Carbs per snack if hungry  Include protein in moderation with your meals and snacks Consider reading food labels for Total Carbohydrate and Fat Grams of foods Consider  increasing your activity level by walking for 30 minutes daily as tolerated

## 2015-06-28 ENCOUNTER — Ambulatory Visit: Payer: PRIVATE HEALTH INSURANCE | Admitting: Adult Health

## 2015-07-09 ENCOUNTER — Ambulatory Visit (INDEPENDENT_AMBULATORY_CARE_PROVIDER_SITE_OTHER): Payer: PRIVATE HEALTH INSURANCE | Admitting: Adult Health

## 2015-07-09 ENCOUNTER — Encounter: Payer: Self-pay | Admitting: Adult Health

## 2015-07-09 VITALS — BP 100/64 | Temp 98.1°F | Ht 68.0 in | Wt 247.4 lb

## 2015-07-09 DIAGNOSIS — R739 Hyperglycemia, unspecified: Secondary | ICD-10-CM

## 2015-07-09 DIAGNOSIS — I10 Essential (primary) hypertension: Secondary | ICD-10-CM | POA: Diagnosis not present

## 2015-07-09 DIAGNOSIS — Z7689 Persons encountering health services in other specified circumstances: Secondary | ICD-10-CM

## 2015-07-09 DIAGNOSIS — Z7189 Other specified counseling: Secondary | ICD-10-CM | POA: Diagnosis not present

## 2015-07-09 LAB — POCT GLYCOSYLATED HEMOGLOBIN (HGB A1C): HEMOGLOBIN A1C: 6.1

## 2015-07-09 NOTE — Patient Instructions (Addendum)
It was great meeting you today!  You are doing great on your weight loss.   You have dropped your A1c from 6.8 to 6.1. Continue with what you are doing. Try increasing aerobic exercise.   Follow up with me for your physical   Health Maintenance, Male A healthy lifestyle and preventative care can promote health and wellness.  Maintain regular health, dental, and eye exams.  Eat a healthy diet. Foods like vegetables, fruits, whole grains, low-fat dairy products, and lean protein foods contain the nutrients you need and are low in calories. Decrease your intake of foods high in solid fats, added sugars, and salt. Get information about a proper diet from your health care provider, if necessary.  Regular physical exercise is one of the most important things you can do for your health. Most adults should get at least 150 minutes of moderate-intensity exercise (any activity that increases your heart rate and causes you to sweat) each week. In addition, most adults need muscle-strengthening exercises on 2 or more days a week.   Maintain a healthy weight. The body mass index (BMI) is a screening tool to identify possible weight problems. It provides an estimate of body fat based on height and weight. Your health care provider can find your BMI and can help you achieve or maintain a healthy weight. For males 20 years and older:  A BMI below 18.5 is considered underweight.  A BMI of 18.5 to 24.9 is normal.  A BMI of 25 to 29.9 is considered overweight.  A BMI of 30 and above is considered obese.  Maintain normal blood lipids and cholesterol by exercising and minimizing your intake of saturated fat. Eat a balanced diet with plenty of fruits and vegetables. Blood tests for lipids and cholesterol should begin at age 65 and be repeated every 5 years. If your lipid or cholesterol levels are high, you are over age 43, or you are at high risk for heart disease, you may need your cholesterol levels checked  more frequently.Ongoing high lipid and cholesterol levels should be treated with medicines if diet and exercise are not working.  If you smoke, find out from your health care provider how to quit. If you do not use tobacco, do not start.  Lung cancer screening is recommended for adults aged 19-80 years who are at high risk for developing lung cancer because of a history of smoking. A yearly low-dose CT scan of the lungs is recommended for people who have at least a 30-pack-year history of smoking and are current smokers or have quit within the past 15 years. A pack year of smoking is smoking an average of 1 pack of cigarettes a day for 1 year (for example, a 30-pack-year history of smoking could mean smoking 1 pack a day for 30 years or 2 packs a day for 15 years). Yearly screening should continue until the smoker has stopped smoking for at least 15 years. Yearly screening should be stopped for people who develop a health problem that would prevent them from having lung cancer treatment.  If you choose to drink alcohol, do not have more than 2 drinks per day. One drink is considered to be 12 oz (360 mL) of beer, 5 oz (150 mL) of wine, or 1.5 oz (45 mL) of liquor.  Avoid the use of street drugs. Do not share needles with anyone. Ask for help if you need support or instructions about stopping the use of drugs.  High blood pressure causes  heart disease and increases the risk of stroke. High blood pressure is more likely to develop in:  People who have blood pressure in the end of the normal range (100-139/85-89 mm Hg).  People who are overweight or obese.  People who are African American.  If you are 17-28 years of age, have your blood pressure checked every 3-5 years. If you are 33 years of age or older, have your blood pressure checked every year. You should have your blood pressure measured twice--once when you are at a hospital or clinic, and once when you are not at a hospital or clinic. Record  the average of the two measurements. To check your blood pressure when you are not at a hospital or clinic, you can use:  An automated blood pressure machine at a pharmacy.  A home blood pressure monitor.  If you are 68-36 years old, ask your health care provider if you should take aspirin to prevent heart disease.  Diabetes screening involves taking a blood sample to check your fasting blood sugar level. This should be done once every 3 years after age 51 if you are at a normal weight and without risk factors for diabetes. Testing should be considered at a younger age or be carried out more frequently if you are overweight and have at least 1 risk factor for diabetes.  Colorectal cancer can be detected and often prevented. Most routine colorectal cancer screening begins at the age of 36 and continues through age 51. However, your health care provider may recommend screening at an earlier age if you have risk factors for colon cancer. On a yearly basis, your health care provider may provide home test kits to check for hidden blood in the stool. A small camera at the end of a tube may be used to directly examine the colon (sigmoidoscopy or colonoscopy) to detect the earliest forms of colorectal cancer. Talk to your health care provider about this at age 30 when routine screening begins. A direct exam of the colon should be repeated every 5-10 years through age 49, unless early forms of precancerous polyps or small growths are found.  People who are at an increased risk for hepatitis B should be screened for this virus. You are considered at high risk for hepatitis B if:  You were born in a country where hepatitis B occurs often. Talk with your health care provider about which countries are considered high risk.  Your parents were born in a high-risk country and you have not received a shot to protect against hepatitis B (hepatitis B vaccine).  You have HIV or AIDS.  You use needles to inject  street drugs.  You live with, or have sex with, someone who has hepatitis B.  You are a man who has sex with other men (MSM).  You get hemodialysis treatment.  You take certain medicines for conditions like cancer, organ transplantation, and autoimmune conditions.  Hepatitis C blood testing is recommended for all people born from [birth year] through 1964 and any individual with known risk factors for hepatitis C.  Healthy men should no longer receive prostate-specific antigen (PSA) blood tests as part of routine cancer screening. Talk to your health care provider about prostate cancer screening.  Testicular cancer screening is not recommended for adolescents or adult males who have no symptoms. Screening includes self-exam, a health care provider exam, and other screening tests. Consult with your health care provider about any symptoms you have or any concerns you have about  testicular cancer.  Practice safe sex. Use condoms and avoid high-risk sexual practices to reduce the spread of sexually transmitted infections (STIs).  You should be screened for STIs, including gonorrhea and chlamydia if:  You are sexually active and are younger than 24 years.  You are older than 24 years, and your health care provider tells you that you are at risk for this type of infection.  Your sexual activity has changed since you were last screened, and you are at an increased risk for chlamydia or gonorrhea. Ask your health care provider if you are at risk.  If you are at risk of being infected with HIV, it is recommended that you take a prescription medicine daily to prevent HIV infection. This is called pre-exposure prophylaxis (PrEP). You are considered at risk if:  You are a man who has sex with other men (MSM).  You are a heterosexual man who is sexually active with multiple partners.  You take drugs by injection.  You are sexually active with a partner who has HIV.  Talk with your health care provider  about whether you are at high risk of being infected with HIV. If you choose to begin PrEP, you should first be tested for HIV. You should then be tested every 3 months for as long as you are taking PrEP.  Use sunscreen. Apply sunscreen liberally and repeatedly throughout the day. You should seek shade when your shadow is shorter than you. Protect yourself by wearing long sleeves, pants, a wide-brimmed hat, and sunglasses year round whenever you are outdoors.  Tell your health care provider of new moles or changes in moles, especially if there is a change in shape or color. Also, tell your health care provider if a mole is larger than the size of a pencil eraser.  A one-time screening for abdominal aortic aneurysm (AAA) and surgical repair of large AAAs by ultrasound is recommended for men aged 34-75 years who are current or former smokers.  Stay current with your vaccines (immunizations).   This information is not intended to replace advice given to you by your health care provider. Make sure you discuss any questions you have with your health care provider.   Document Released: 08/05/2007 Document Revised: 02/27/2014 Document Reviewed: 07/04/2010 Elsevier Interactive Patient Education [employer].

## 2015-07-09 NOTE — Progress Notes (Signed)
Patient presents to clinic today to establish care. He is a pleasant 54 year old caucasian male who  has a past medical history of Hyperlipidemia; Hypertension; and Gout.   His last physical was in 03/2015 with MD Sherren Mocha  He takes with routine eye care, dental care, colonoscopy 2013 at [facility] was normal.   Acute Concerns: Establish Care   Hyperglycemia - When he saw Dr. Sherren Mocha in February he had gained a lot of weight and his A1c went up to 6.8. He has been working on diet and exercise.   Wt Readings from Last 3 Encounters:  07/09/15 247 lb 6.4 oz (112.22 kg)  05/21/15 256 lb (116.121 kg)  03/29/15 265 lb (120.203 kg)   Chronic Issues: Hypertension  - Is controlled on Hyzaar.   Gout - Is controlled on Allopurinol. His last flare was years ago  Health Maintenance: Dental -- Twice a year  Vision -- Yearly  Immunizations -- UTD   Colonoscopy -- 2013  Diet: He is eating healthy. His wife cooks for him before he goes out of town.  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   Past Medical History  Diagnosis Date  . Hyperlipidemia   . Hypertension   . Gout     Past Surgical History  Procedure Laterality Date  . Hip arthrodesis w/ iliac crest bone graft      Current Outpatient Prescriptions on File Prior to Visit  Medication Sig Dispense Refill  . allopurinol (ZYLOPRIM) 300 MG tablet TAKE ONE TABLET BY MOUTH ONCE DAILY 100 tablet 3  . losartan-hydrochlorothiazide (HYZAAR) 50-12.5 MG tablet One half tab every morning for high blood pressure 100 tablet 3   No current facility-administered medications on file prior to visit.    No Known Allergies  Family History  Problem Relation Age of Onset  . Heart disease Other     male  . Diabetes Other   . Hyperlipidemia Other   . Hypertension Other     Social History   Social History  . Marital Status: Single    Spouse Name: N/A  . Number of Children: N/A  . Years of Education: N/A   Occupational  History  . Not on file.   Social History Main Topics  . Smoking status: Never Smoker   . Smokeless tobacco: Not on file  . Alcohol Use: No  . Drug Use: No  . Sexual Activity: Not on file   Other Topics Concern  . Not on file   Social History Narrative    Review of Systems  Constitutional: Negative.   Respiratory: Negative.   Cardiovascular: Negative.   Gastrointestinal: Negative.   Genitourinary: Negative.   Musculoskeletal: Negative.   Psychiatric/Behavioral: Negative.   All other systems reviewed and are negative.   Temp(Src) 98.1 F (36.7 C) (Oral)  Ht 5\' 8"  (1.727 m)  Wt 247 lb 6.4 oz (112.22 kg)  BMI 37.63 kg/m2  Physical Exam  Constitutional: He is oriented to person, place, and time and well-developed, well-nourished, and in no distress. No distress.  HENT:  Head: Normocephalic and atraumatic.  Right Ear: External ear normal.  Left Ear: External ear normal.  Nose: Nose normal.  Mouth/Throat: Oropharynx is clear and moist. No oropharyngeal exudate.  Eyes: Conjunctivae and EOM are normal. Pupils are equal, round, and reactive to light. Right eye exhibits no discharge. Left eye exhibits no discharge.  Neck: Normal range of motion. Neck supple. No thyromegaly present.  Cardiovascular: Normal rate,  regular rhythm, normal heart sounds and intact distal pulses.  Exam reveals no gallop.   No murmur heard. Pulmonary/Chest: Effort normal and breath sounds normal. No respiratory distress. He has no wheezes. He has no rales. He exhibits no tenderness.  Musculoskeletal: Normal range of motion. He exhibits no edema or tenderness.  Lymphadenopathy:    He has no cervical adenopathy.  Neurological: He is alert and oriented to person, place, and time. He has normal reflexes. He displays normal reflexes. No cranial nerve deficit. He exhibits normal muscle tone. Gait normal. Coordination normal. GCS score is 15.  Skin: Skin is warm and dry. No rash noted. He is not diaphoretic.  No erythema. No pallor.  Psychiatric: Mood, memory, affect and judgment normal.  Vitals reviewed.   Assessment/Plan:  1. Encounter to establish care - Follow up with me for CPE - Follow up as needed - Continue to work on diet and increasing exercise.   2. Essential hypertension - Well controlled - no change  3. Hyperglycemia - POC HgB A1c 6.1 - Continue with diet and exercise.     Dorothyann Peng, NP

## 2016-01-31 ENCOUNTER — Telehealth: Payer: Self-pay | Admitting: Family Medicine

## 2016-01-31 NOTE — Telephone Encounter (Addendum)
[facility] is telling he has no refills  Can you resend on  losartan-hydrochlorothiazide (HYZAAR) 50-12.5 MG tablet allopurinol (ZYLOPRIM) 300 MG tablet  [facility]/ [address]

## 2016-02-04 ENCOUNTER — Other Ambulatory Visit: Payer: Self-pay | Admitting: Emergency Medicine

## 2016-02-04 MED ORDER — ALLOPURINOL 300 MG PO TABS: ORAL_TABLET | ORAL | 2 refills | Status: DC

## 2016-02-04 MED ORDER — LOSARTAN POTASSIUM-HCTZ 50-12.5 MG PO TABS: ORAL_TABLET | ORAL | 2 refills | Status: DC

## 2016-02-04 NOTE — Telephone Encounter (Signed)
Pt has established with [name].  Prescriptions sent to pharmacy.

## 2016-05-11 ENCOUNTER — Other Ambulatory Visit: Payer: Self-pay | Admitting: Family Medicine

## 2016-05-26 ENCOUNTER — Encounter: Payer: Self-pay | Admitting: Adult Health

## 2016-05-26 ENCOUNTER — Ambulatory Visit (INDEPENDENT_AMBULATORY_CARE_PROVIDER_SITE_OTHER): Payer: PRIVATE HEALTH INSURANCE | Admitting: Adult Health

## 2016-05-26 VITALS — BP 110/76 | Temp 98.2°F | Ht 68.0 in | Wt 241.7 lb

## 2016-05-26 DIAGNOSIS — M10071 Idiopathic gout, right ankle and foot: Secondary | ICD-10-CM

## 2016-05-26 DIAGNOSIS — R7309 Other abnormal glucose: Secondary | ICD-10-CM | POA: Insufficient documentation

## 2016-05-26 DIAGNOSIS — I10 Essential (primary) hypertension: Secondary | ICD-10-CM | POA: Diagnosis not present

## 2016-05-26 DIAGNOSIS — Z0000 Encounter for general adult medical examination without abnormal findings: Secondary | ICD-10-CM

## 2016-05-26 LAB — POC URINALSYSI DIPSTICK (AUTOMATED)
BILIRUBIN UA: NEGATIVE
Glucose, UA: NEGATIVE
Ketones, UA: NEGATIVE
LEUKOCYTES UA: NEGATIVE
NITRITE UA: NEGATIVE
Protein, UA: NEGATIVE
RBC UA: NEGATIVE
Spec Grav, UA: 1.025 (ref 1.030–1.035)
Urobilinogen, UA: 0.2 (ref ?–2.0)
pH, UA: 6 (ref 5.0–8.0)

## 2016-05-26 LAB — LIPID PANEL
CHOL/HDL RATIO: 5
Cholesterol: 184 mg/dL (ref 0–200)
HDL: 39.2 mg/dL (ref >39.00)
LDL Cholesterol: 125 mg/dL — ABNORMAL HIGH (ref 0–99)
NONHDL: 144.46
Triglycerides: 97 mg/dL (ref 0.0–149.0)
VLDL: 19.4 mg/dL (ref 0.0–40.0)

## 2016-05-26 LAB — BASIC METABOLIC PANEL
BUN: 17 mg/dL (ref 6–23)
CALCIUM: 9 mg/dL (ref 8.4–10.5)
CO2: 31 meq/L (ref 19–32)
Chloride: 103 mEq/L (ref 96–112)
Creatinine, Ser: 0.86 mg/dL (ref 0.40–1.50)
GFR: 98.13 mL/min (ref >60.00)
GLUCOSE: 94 mg/dL (ref 70–99)
POTASSIUM: 4 meq/L (ref 3.5–5.1)
SODIUM: 141 meq/L (ref 135–145)

## 2016-05-26 LAB — HEMOGLOBIN A1C: Hgb A1c MFr Bld: 6.3 % (ref 4.6–6.5)

## 2016-05-26 LAB — CBC WITH DIFFERENTIAL/PLATELET
BASOS ABS: 0.1 10*3/uL (ref 0.0–0.1)
Basophils Relative: 0.8 % (ref 0.0–3.0)
EOS PCT: 2.5 % (ref 0.0–5.0)
Eosinophils Absolute: 0.2 10*3/uL (ref 0.0–0.7)
HEMATOCRIT: 41.5 % (ref 39.0–52.0)
Hemoglobin: 13.7 g/dL (ref 13.0–17.0)
LYMPHS ABS: 2.2 10*3/uL (ref 0.7–4.0)
LYMPHS PCT: 32.2 % (ref 12.0–46.0)
MCHC: 33 g/dL (ref 30.0–36.0)
MCV: 89.4 fl (ref 78.0–100.0)
MONOS PCT: 9.7 % (ref 3.0–12.0)
Monocytes Absolute: 0.7 10*3/uL (ref 0.1–1.0)
Neutro Abs: 3.8 10*3/uL (ref 1.4–7.7)
Neutrophils Relative %: 54.8 % (ref 43.0–77.0)
Platelets: 197 10*3/uL (ref 150.0–400.0)
RBC: 4.64 Mil/uL (ref 4.22–5.81)
RDW: 13.7 % (ref 11.5–15.5)
WBC: 6.9 10*3/uL (ref 4.0–10.5)

## 2016-05-26 LAB — PSA: PSA: 0.17 ng/mL (ref 0.10–4.00)

## 2016-05-26 LAB — HEPATIC FUNCTION PANEL
ALBUMIN: 4.2 g/dL (ref 3.5–5.2)
ALK PHOS: 83 U/L (ref 39–117)
ALT: 15 U/L (ref 0–53)
AST: 12 U/L (ref 0–37)
BILIRUBIN DIRECT: 0.1 mg/dL (ref 0.0–0.3)
TOTAL PROTEIN: 6.9 g/dL (ref 6.0–8.3)
Total Bilirubin: 0.6 mg/dL (ref 0.2–1.2)

## 2016-05-26 LAB — TSH: TSH: 2.32 u[IU]/mL (ref 0.35–4.50)

## 2016-05-26 MED ORDER — LOSARTAN POTASSIUM-HCTZ 50-12.5 MG PO TABS: ORAL_TABLET | ORAL | 3 refills | Status: DC

## 2016-05-26 MED ORDER — ALLOPURINOL 300 MG PO TABS: ORAL_TABLET | ORAL | 3 refills | Status: DC

## 2016-05-26 NOTE — Progress Notes (Signed)
Subjective:    Patient ID: Dylan Estrada, male    DOB: [birth year], 56 y.o.   MRN: [id number]  HPI  Patient presents for yearly preventative medicine examination. He is a pleasant 56 year old male who  has a past medical history of Gout; Hemorrhoids; Hyperlipidemia; and Hypertension.  Wt Readings from Last 3 Encounters:  05/26/16 241 lb 11.2 oz (109.6 kg)  07/09/15 247 lb 6.4 oz (112.2 kg)  05/21/15 256 lb (116.1 kg)   All immunizations and health maintenance protocols were reviewed with the patient and needed orders were placed. He is up to date on all of his vaccinations  Appropriate screening laboratory values were ordered for the patient including screening of hyperlipidemia, renal function and hepatic function. If indicated by BPH, a PSA was ordered.  Medication reconciliation,  past medical history, social history, problem list and allergies were reviewed in detail with the patient  Goals were established with regard to weight loss, exercise, and  diet in compliance with medications. He is working on diet and has started exercising.    He is up to date on his colonoscopy. He does routine care and vision care  He has no acute complaints   Review of Systems  Constitutional: Negative.   HENT: Negative.   Eyes: Negative.   Respiratory: Negative.   Cardiovascular: Negative.   Gastrointestinal: Negative.   Endocrine: Negative.   Genitourinary: Negative.   Musculoskeletal: Negative.   Allergic/Immunologic: Negative.   Neurological: Negative.   Hematological: Negative.   Psychiatric/Behavioral: Negative.   All other systems reviewed and are negative.  Past Medical History:  Diagnosis Date  . Gout   . Hemorrhoids   . Hyperlipidemia   . Hypertension     Social History   Social History  . Marital status: Single    Spouse name: N/A  . Number of children: N/A  . Years of education: N/A   Occupational History  . Not on file.   Social History Main Topics  .  Smoking status: Never Smoker  . Smokeless tobacco: Never Used  . Alcohol use No  . Drug use: No  . Sexual activity: Not on file   Other Topics Concern  . Not on file   Social History Narrative   Married   Two step kids   7 grandchildren    2 great grand kids             Past Surgical History:  Procedure Laterality Date  . HERNIA REPAIR    . HIP ARTHRODESIS W/ ILIAC CREST BONE GRAFT    . KNEE ARTHROPLASTY Left     Family History  Problem Relation Age of Onset  . Diabetes Mother   . Hyperlipidemia Father   . Pancreatic cancer Father   . Kidney failure Mother     No Known Allergies  No current outpatient prescriptions on file prior to visit.   No current facility-administered medications on file prior to visit.     BP 110/76 (BP Location: Left Arm, Patient Position: Sitting, Cuff Size: Normal)   Temp 98.2 F (36.8 C) (Oral)   Ht 5\' 8"  (1.727 m)   Wt 241 lb 11.2 oz (109.6 kg)   BMI 36.75 kg/m       Objective:   Physical Exam  Constitutional: He is oriented to person, place, and time. He appears well-developed and well-nourished. No distress.  HENT:  Head: Normocephalic and atraumatic.  Right Ear: Hearing, tympanic membrane, external ear and ear canal normal.  Left Ear: Hearing, tympanic membrane, external ear and ear canal normal.  Nose: Nose normal.  Mouth/Throat: Uvula is midline, oropharynx is clear and moist and mucous membranes are normal. No oropharyngeal exudate.  Eyes: Conjunctivae and EOM are normal. Pupils are equal, round, and reactive to light. Right eye exhibits no discharge. Left eye exhibits no discharge. No scleral icterus.  Neck: Trachea normal and normal range of motion. No JVD present. Carotid bruit is not present. No tracheal deviation present. No thyroid mass and no thyromegaly present.  Cardiovascular: Normal rate, regular rhythm, normal heart sounds and intact distal pulses.  Exam reveals no gallop and no friction rub.   No murmur  heard. Pulmonary/Chest: Effort normal and breath sounds normal. No stridor. No respiratory distress. He has no wheezes. He has no rales. He exhibits no tenderness.  Abdominal: Soft. Bowel sounds are normal. He exhibits no distension and no mass. There is no tenderness. There is no rebound and no guarding. A hernia is present. Hernia confirmed positive in the ventral area.  Genitourinary: Prostate normal. Rectal exam shows external hemorrhoid. Rectal exam shows guaiac negative stool.  Musculoskeletal: Normal range of motion. He exhibits no edema, tenderness or deformity.  Lymphadenopathy:    He has no cervical adenopathy.  Neurological: He is alert and oriented to person, place, and time. He has normal reflexes. He displays normal reflexes. No cranial nerve deficit. He exhibits normal muscle tone. Coordination normal.  Skin: Skin is warm and dry. No rash noted. He is not diaphoretic. No erythema. No pallor.  Psychiatric: He has a normal mood and affect. His behavior is normal. Judgment and thought content normal.  Nursing note and vitals reviewed.     Assessment & Plan:  1. Essential hypertension - Well controlled.  - Basic metabolic panel - CBC with Differential/Platelet - Hemoglobin A1c - Hepatic function panel - Lipid panel - POCT Urinalysis Dipstick (Automated) - PSA - TSH - losartan-hydrochlorothiazide (HYZAAR) 50-12.5 MG tablet; One half tab every morning for high blood pressure  Dispense: 90 tablet; Refill: 3  2. Elevated glucose  - Basic metabolic panel - CBC with Differential/Platelet - Hemoglobin A1c - Hepatic function panel - Lipid panel - POCT Urinalysis Dipstick (Automated) - PSA - TSH  3. Routine general medical examination at a health care facility - Continue to diet and exercise.  - Follow up in 1 year or sooner if needed - Basic metabolic panel - CBC with Differential/Platelet - Hemoglobin A1c - Hepatic function panel - Lipid panel - POCT Urinalysis  Dipstick (Automated) - PSA - TSH  4. Acute idiopathic gout of right foot - He has had 3 gout attacks in his life. We talked about stopping allopurinol. I am ok with him stopping this medication for the time being. I will send it in if he needs is - allopurinol (ZYLOPRIM) 300 MG tablet; TAKE ONE TABLET BY MOUTH ONCE DAILY  Dispense: 90 tablet; Refill: 3  Dorothyann Peng, NP

## 2017-02-22 ENCOUNTER — Encounter: Payer: Self-pay | Admitting: Adult Health

## 2017-02-22 ENCOUNTER — Ambulatory Visit: Payer: PRIVATE HEALTH INSURANCE | Admitting: Adult Health

## 2017-02-22 VITALS — BP 132/80 | Temp 98.2°F | Wt 256.0 lb

## 2017-02-22 DIAGNOSIS — M25511 Pain in right shoulder: Secondary | ICD-10-CM

## 2017-02-22 MED ORDER — METHYLPREDNISOLONE ACETATE 80 MG/ML IJ SUSP: 80.0000 mg | Freq: Once | INTRAMUSCULAR | Status: DC

## 2017-02-22 MED ORDER — METHYLPREDNISOLONE ACETATE 40 MG/ML IJ SUSP
40.0000 mg | Freq: Once | INTRAMUSCULAR | Status: AC
  Administered 2017-02-22: 80 mg via INTRAMUSCULAR

## 2017-02-22 NOTE — Patient Instructions (Signed)
It was great seeing you today   I have ordered an MRI of your shoulder, somone will call you to set up at appointment.   Once we see what is going on, then we will decide what we need to do next   Please let me know if you need anything

## 2017-02-22 NOTE — Progress Notes (Signed)
Subjective:    Patient ID: Dylan Estrada, male    DOB: [birth year], 57 y.o.   MRN: [id number]  HPI  57 year old male who  has a past medical history of Gout, Hemorrhoids, Hyperlipidemia, and Hypertension.  He presents to the office today for an acute issue of right shoulder pain x 1 month that has been progressively worse over the last week. He reports that over the last month he has had " aches and pain" in his shoulder. At the beginning of this week he started to have more pain and noticed that he had decreased ROM and worsening pain. He has no loss of grip strength. Denies any trauma but does lift heavy objects at work   Unable to raise arm above head   Review of Systems See HPI   Past Medical History:  Diagnosis Date  . Gout   . Hemorrhoids   . Hyperlipidemia   . Hypertension     Social History   Socioeconomic History  . Marital status: Single    Spouse name: Not on file  . Number of children: Not on file  . Years of education: Not on file  . Highest education level: Not on file  Social Needs  . Financial resource strain: Not on file  . Food insecurity - worry: Not on file  . Food insecurity - inability: Not on file  . Transportation needs - medical: Not on file  . Transportation needs - non-medical: Not on file  Occupational History  . Not on file  Tobacco Use  . Smoking status: Never Smoker  . Smokeless tobacco: Never Used  Substance and Sexual Activity  . Alcohol use: No  . Drug use: No  . Sexual activity: Not on file  Other Topics Concern  . Not on file  Social History Narrative   Married   Two step kids   7 grandchildren    2 great grand kids          Past Surgical History:  Procedure Laterality Date  . HERNIA REPAIR    . HIP ARTHRODESIS W/ ILIAC CREST BONE GRAFT    . KNEE ARTHROPLASTY Left     Family History  Problem Relation Age of Onset  . Diabetes Mother   . Hyperlipidemia Father   . Pancreatic cancer Father   . Kidney failure Mother      No Known Allergies  Current Outpatient Medications on File Prior to Visit  Medication Sig Dispense Refill  . allopurinol (ZYLOPRIM) 300 MG tablet TAKE ONE TABLET BY MOUTH ONCE DAILY 90 tablet 3  . losartan-hydrochlorothiazide (HYZAAR) 50-12.5 MG tablet One half tab every morning for high blood pressure 90 tablet 3   No current facility-administered medications on file prior to visit.     BP 132/80 (BP Location: Left Arm)   Temp 98.2 F (36.8 C) (Oral)   Wt 256 lb (116.1 kg)   BMI 38.92 kg/m       Objective:   Physical Exam  Constitutional: He is oriented to person, place, and time. He appears well-developed and well-nourished. No distress.  Cardiovascular: Normal rate, regular rhythm, normal heart sounds and intact distal pulses. Exam reveals no gallop and no friction rub.  No murmur heard. Pulmonary/Chest: Effort normal and breath sounds normal. No respiratory distress. He has no wheezes. He has no rales. He exhibits no tenderness.  Musculoskeletal: He exhibits tenderness. He exhibits no edema.       Right shoulder: He exhibits decreased range  of motion, tenderness, bony tenderness, crepitus and pain. He exhibits no swelling, normal pulse and normal strength.  Neurological: He is alert and oriented to person, place, and time.  Skin: Skin is warm and dry. No rash noted. He is not diaphoretic. No erythema. No pallor.  Psychiatric: He has a normal mood and affect. His behavior is normal. Judgment and thought content normal.  Nursing note and vitals reviewed.     Assessment & Plan:  1. Acute pain of right shoulder Discussed risks and benefits of corticosteroid injection and patient consented.  After prepping skin with betadine, injected 80 mg depomedrol and 2 cc of plain xylocaine with 22 gauge one and one half inch needle using anterolateral approach and pt tolerated well  - Concern for rotator cuff tear. Injected shoulder today. Will order MRI and likely send to orthopedics    - MR Shoulder Right Wo Contrast; Future - methylPREDNISolone acetate (DEPO-MEDROL) injection 80 mg - Refused pain medication and arm splint  - Can use tylenol motrin - follow up as needed  Dorothyann Peng, NP

## 2017-02-22 NOTE — Addendum Note (Signed)
Addended by: Miles Costain T on: 02/22/2017 03:08 PM   Modules accepted: Orders

## 2017-03-10 ENCOUNTER — Other Ambulatory Visit: Payer: PRIVATE HEALTH INSURANCE

## 2017-03-24 ENCOUNTER — Ambulatory Visit
Admission: RE | Admit: 2017-03-24 | Discharge: 2017-03-24 | Disposition: A | Discharge status: Home / Self Care | Payer: PRIVATE HEALTH INSURANCE | Source: Ambulatory Visit | Attending: Adult Health | Admitting: Adult Health

## 2017-03-24 DIAGNOSIS — M25511 Pain in right shoulder: Secondary | ICD-10-CM

## 2017-03-26 ENCOUNTER — Other Ambulatory Visit: Payer: Self-pay | Admitting: Family Medicine

## 2017-03-26 DIAGNOSIS — M67813 Other specified disorders of tendon, right shoulder: Secondary | ICD-10-CM

## 2017-03-26 DIAGNOSIS — S4991XA Unspecified injury of right shoulder and upper arm, initial encounter: Secondary | ICD-10-CM

## 2017-04-03 ENCOUNTER — Telehealth: Payer: Self-pay | Admitting: Family Medicine

## 2017-04-03 NOTE — Telephone Encounter (Signed)
Copied from [place] [id number]. Topic: Quick Communication - See Telephone Encounter >> Apr 03, 2017  9:13 AM Hewitt Shorts wrote: CRM for notification. See Telephone encounter for:  Dodie -from [facility]  is calling to confirm that pt has a diagnosis of diabetes so she will know what to code his visit there   Best number is [phone]  04/03/17.

## 2017-04-03 NOTE — Telephone Encounter (Signed)
Ok to use DM type 2

## 2017-04-03 NOTE — Telephone Encounter (Signed)
Spoke to answering service.  Message to be relayed to [facility] that pt has a dx of DM type 2.  Left return phone number if needed.

## 2017-04-03 NOTE — Telephone Encounter (Signed)
Dylan Estrada,  Reviewed chart.  Dr. Sherren Mocha used a dx code of diabetes when he last seen him on 03/29/15.  I see that you have been using hyperglycemia.  Please advise.

## 2017-04-04 ENCOUNTER — Ambulatory Visit (INDEPENDENT_AMBULATORY_CARE_PROVIDER_SITE_OTHER): Payer: PRIVATE HEALTH INSURANCE | Admitting: Orthopaedic Surgery

## 2017-04-04 ENCOUNTER — Encounter (INDEPENDENT_AMBULATORY_CARE_PROVIDER_SITE_OTHER): Payer: Self-pay | Admitting: Orthopaedic Surgery

## 2017-04-04 DIAGNOSIS — M7541 Impingement syndrome of right shoulder: Secondary | ICD-10-CM | POA: Diagnosis not present

## 2017-04-04 NOTE — Progress Notes (Signed)
Office Visit Note   Patient: Dylan Estrada           Date of Birth: 1960-05-10           MRN: [id number] Visit Date: 04/04/2017              Requested by: Dorothyann Peng, NP [address] [place], [place] [address] PCP: Dorothyann Peng, NP   Assessment & Plan: Visit Diagnoses:  1. Impingement syndrome of right shoulder     Plan: Due to his severe impingement syndrome of the right shoulder followed by failure of conservative treatment we are recommending arthroscopic intervention of the shoulder.  I went over his MRI with him in detail what his clinical exam findings.  Talked about surgery in general as well as the risk and benefits of this.  At this point he is failed all forms of conservative treatment.  He would need a right shoulder arthroscopy with debridement subacromial decompression and distal clavicle resection to be done arthroscopically.  We talked about his intraoperative and postoperative course and what all this would involve in terms of recovery.  All the risk and benefits were addressed and assessed.  We talked in detail about the surgery using a shoulder model.  All questions concerns were answered and addressed.  He would like to talk to his wife about this as well.  I gave him our surgery schedulers card and will happy to set this up as an outpatient.  We will give [place] a call and let us know.  Follow-Up Instructions: Return for 1 week post-op.   Orders:  No orders of the defined types were placed in this encounter.  No orders of the defined types were placed in this encounter.     Procedures: No procedures performed   Clinical Data: No additional findings.   Subjective: Chief Complaint  Patient presents with  . Right Shoulder - Pain  The patient somewhat I am seeing for the first time.  He comes for evaluation treatment of known right shoulder impingement syndrome with AC joint arthritis and arthropathy.  This is all been diagnosed by a primary care  sports medicine physician.  He has had injections in his shoulders already.  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  This is been going on for a long period time he is tried activity modification as well as anti-inflammatories and exercise program.  An MRI accompanies him for my review of his right shoulder.  He denies any weakness in the shoulder but it hurts with most activities with abduction of the shoulder and overhead.  It hurts reaching behind him as well.  He denies any neck pain.  Denies any numbness tingling in his right hand.  HPI  Review of Systems He currently denies any headache, chest pain, shortness of breath, fever, chills, nausea, vomiting.  Objective: Vital Signs: There were no vitals taken for this visit.  Physical Exam Is alert and oriented x3 and in no acute distress. Ortho Exam Examination of his right shoulder shows full range of motion the shoulder.  He does have positive Neer and Hawkins signs.  Her tolerance subacromial outlet especially at his acromioclavicular joint. Specialty Comments:  No specialty comments available.  Imaging: No results found. An MRI of his right shoulder independently reviewed shows severe arthropathy of the [facility] joint of the right shoulder.  He also has moderate tendinosis of the rotator cuff.  There is subacromial and  subdeltoid bursitis as well.  There is no discrete rotator cuff tear.   PMFS History: Patient Active Problem List   Diagnosis Date Noted  . Impingement syndrome of right shoulder 04/04/2017  . Elevated glucose 05/26/2016  . Routine general medical examination at a health care facility 03/29/2015  . Obesity 03/29/2015  . Gout attack 04/18/2011  . LOW HDL 10/29/2008  . Essential hypertension 09/03/2007  . ABDOMINAL WALL HERNIA 08/20/2007  . RECTAL BLEEDING, HX OF 08/20/2007   Past Medical History:  Diagnosis Date  . Gout   . Hemorrhoids   . Hyperlipidemia   . Hypertension      Family History  Problem Relation Age of Onset  . Diabetes Mother   . Hyperlipidemia Father   . Pancreatic cancer Father   . Kidney failure Mother     Past Surgical History:  Procedure Laterality Date  . HERNIA REPAIR    . HIP ARTHRODESIS W/ ILIAC CREST BONE GRAFT    . KNEE ARTHROPLASTY Left    Social History   Occupational History  . Not on file  Tobacco Use  . Smoking status: Never Smoker  . Smokeless tobacco: Never Used  Substance and Sexual Activity  . Alcohol use: No  . Drug use: No  . Sexual activity: Not on file

## 2017-07-12 ENCOUNTER — Encounter: Payer: Self-pay | Admitting: Orthopaedic Surgery

## 2017-07-12 ENCOUNTER — Telehealth (INDEPENDENT_AMBULATORY_CARE_PROVIDER_SITE_OTHER): Payer: Self-pay | Admitting: Orthopaedic Surgery

## 2017-07-12 DIAGNOSIS — M7541 Impingement syndrome of right shoulder: Secondary | ICD-10-CM | POA: Diagnosis not present

## 2017-07-12 DIAGNOSIS — M19011 Primary osteoarthritis, right shoulder: Secondary | ICD-10-CM | POA: Diagnosis not present

## 2017-07-12 NOTE — Telephone Encounter (Signed)
Verbal ok given.

## 2017-07-12 NOTE — Telephone Encounter (Signed)
Pharmacy said directions would need to be changed because there is a max of 6 tablets per day, so something like 1 tablet every 4 hours? Please call to change # [phone]

## 2017-07-25 ENCOUNTER — Ambulatory Visit (INDEPENDENT_AMBULATORY_CARE_PROVIDER_SITE_OTHER): Payer: PRIVATE HEALTH INSURANCE | Admitting: Orthopaedic Surgery

## 2017-07-25 ENCOUNTER — Encounter (INDEPENDENT_AMBULATORY_CARE_PROVIDER_SITE_OTHER): Payer: Self-pay | Admitting: Orthopaedic Surgery

## 2017-07-25 VITALS — Ht 68.0 in | Wt 256.0 lb

## 2017-07-25 DIAGNOSIS — Z9889 Other specified postprocedural states: Secondary | ICD-10-CM

## 2017-07-25 NOTE — Progress Notes (Signed)
The patient is 2 weeks status post a right shoulder arthroscopy with a significant subacromial decompression.  He is still working slowly and getting his shoulder back moving but this is doing well overall.  He is been taking easy at work but he is back to work.  I do have his arthroscopy pictures to go over.  Sutures been removed from his right shoulder.  He can abduct and raise his shoulder over his head on the right side.  He is slow due to some sort and painful but otherwise she has an intact axillary nerve and intact rotator cuff.  I did go over his arthroscopy pictures with him showing detail the extent of [place] debriding tendinosis of the rotator cuff and performing subacromial decompression.  The major structures of the shoulder were all intact otherwise.  This point he is doing well in terms of increasing activities as comfort allows.  He will still go slow on overhead activities and heavy lifting.  I would like to see him back in a month to see how is doing overall.  All questions concerns were answered and addressed.

## 2017-08-22 ENCOUNTER — Ambulatory Visit (INDEPENDENT_AMBULATORY_CARE_PROVIDER_SITE_OTHER): Payer: PRIVATE HEALTH INSURANCE | Admitting: Orthopaedic Surgery

## 2017-08-29 ENCOUNTER — Encounter (INDEPENDENT_AMBULATORY_CARE_PROVIDER_SITE_OTHER): Payer: Self-pay | Admitting: Orthopaedic Surgery

## 2017-08-29 ENCOUNTER — Ambulatory Visit (INDEPENDENT_AMBULATORY_CARE_PROVIDER_SITE_OTHER): Payer: PRIVATE HEALTH INSURANCE | Admitting: Orthopaedic Surgery

## 2017-08-29 DIAGNOSIS — Z9889 Other specified postprocedural states: Secondary | ICD-10-CM

## 2017-08-29 NOTE — Progress Notes (Signed)
The patient is 7 weeks status post a right shoulder arthroscopy with extensive debridement and subacromial decompression.  He reports overall he is doing better.  He still has some pain with overhead activities but he feels like he is making progress.  On exam he still has pain with overhead motion and internal rotation with adduction but his mobility is improved enough to go not needing any type of physical therapy.  I did offer him a steroid injection to help with pain but he says he is still slowly continue to improve so he does not feel like he needs an injection.  We had a long thorough discussion about the use of the shoulder.  At this point he will follow-up as needed.  He understands that if he continues to have pain over the next few months that I would always consider a subacromial steroid injection if needed.

## 2017-09-26 ENCOUNTER — Ambulatory Visit (INDEPENDENT_AMBULATORY_CARE_PROVIDER_SITE_OTHER): Payer: PRIVATE HEALTH INSURANCE | Admitting: Adult Health

## 2017-09-26 ENCOUNTER — Encounter: Payer: Self-pay | Admitting: Adult Health

## 2017-09-26 VITALS — BP 134/82 | Temp 98.0°F | Ht 68.8 in | Wt 263.0 lb

## 2017-09-26 DIAGNOSIS — Z0000 Encounter for general adult medical examination without abnormal findings: Secondary | ICD-10-CM

## 2017-09-26 DIAGNOSIS — C61 Malignant neoplasm of prostate: Secondary | ICD-10-CM | POA: Diagnosis not present

## 2017-09-26 DIAGNOSIS — M10071 Idiopathic gout, right ankle and foot: Secondary | ICD-10-CM | POA: Diagnosis not present

## 2017-09-26 DIAGNOSIS — E119 Type 2 diabetes mellitus without complications: Secondary | ICD-10-CM | POA: Insufficient documentation

## 2017-09-26 DIAGNOSIS — E118 Type 2 diabetes mellitus with unspecified complications: Secondary | ICD-10-CM | POA: Diagnosis not present

## 2017-09-26 DIAGNOSIS — I10 Essential (primary) hypertension: Secondary | ICD-10-CM

## 2017-09-26 LAB — LIPID PANEL
CHOLESTEROL: 175 mg/dL (ref 0–200)
HDL: 37.1 mg/dL — AB (ref >39.00)
LDL Cholesterol: 107 mg/dL — ABNORMAL HIGH (ref 0–99)
NonHDL: 137.91
TRIGLYCERIDES: 156 mg/dL — AB (ref 0.0–149.0)
Total CHOL/HDL Ratio: 5
VLDL: 31.2 mg/dL (ref 0.0–40.0)

## 2017-09-26 LAB — CBC WITH DIFFERENTIAL/PLATELET
Basophils Absolute: 0.1 10*3/uL (ref 0.0–0.1)
Basophils Relative: 0.9 % (ref 0.0–3.0)
EOS ABS: 0.2 10*3/uL (ref 0.0–0.7)
EOS PCT: 3.6 % (ref 0.0–5.0)
HCT: 40 % (ref 39.0–52.0)
HEMOGLOBIN: 13.6 g/dL (ref 13.0–17.0)
LYMPHS ABS: 2.1 10*3/uL (ref 0.7–4.0)
Lymphocytes Relative: 30.6 % (ref 12.0–46.0)
MCHC: 33.9 g/dL (ref 30.0–36.0)
MCV: 87.2 fl (ref 78.0–100.0)
Monocytes Absolute: 0.7 10*3/uL (ref 0.1–1.0)
Monocytes Relative: 9.9 % (ref 3.0–12.0)
NEUTROS PCT: 55 % (ref 43.0–77.0)
Neutro Abs: 3.8 10*3/uL (ref 1.4–7.7)
Platelets: 197 10*3/uL (ref 150.0–400.0)
RBC: 4.59 Mil/uL (ref 4.22–5.81)
RDW: 13.8 % (ref 11.5–15.5)
WBC: 6.9 10*3/uL (ref 4.0–10.5)

## 2017-09-26 LAB — HEPATIC FUNCTION PANEL
ALBUMIN: 4.3 g/dL (ref 3.5–5.2)
ALT: 26 U/L (ref 0–53)
AST: 16 U/L (ref 0–37)
Alkaline Phosphatase: 83 U/L (ref 39–117)
Bilirubin, Direct: 0.1 mg/dL (ref 0.0–0.3)
Total Bilirubin: 0.8 mg/dL (ref 0.2–1.2)
Total Protein: 6.9 g/dL (ref 6.0–8.3)

## 2017-09-26 LAB — BASIC METABOLIC PANEL
BUN: 14 mg/dL (ref 6–23)
CALCIUM: 9.5 mg/dL (ref 8.4–10.5)
CO2: 31 mEq/L (ref 19–32)
CREATININE: 0.95 mg/dL (ref 0.40–1.50)
Chloride: 104 mEq/L (ref 96–112)
GFR: 87.05 mL/min (ref >60.00)
GLUCOSE: 106 mg/dL — AB (ref 70–99)
Potassium: 4.3 mEq/L (ref 3.5–5.1)
Sodium: 141 mEq/L (ref 135–145)

## 2017-09-26 LAB — TSH: TSH: 2.64 u[IU]/mL (ref 0.35–4.50)

## 2017-09-26 LAB — PSA: PSA: 0.47 ng/mL (ref 0.10–4.00)

## 2017-09-26 LAB — HEMOGLOBIN A1C: HEMOGLOBIN A1C: 6.7 % — AB (ref 4.6–6.5)

## 2017-09-26 MED ORDER — ALLOPURINOL 300 MG PO TABS: ORAL_TABLET | ORAL | 3 refills | Status: DC

## 2017-09-26 MED ORDER — LOSARTAN POTASSIUM-HCTZ 50-12.5 MG PO TABS: ORAL_TABLET | ORAL | 3 refills | Status: DC

## 2017-09-26 NOTE — Progress Notes (Signed)
Subjective:    Patient ID: Dylan Estrada, male    DOB: 1960-04-13, 57 y.o.   MRN: [id number]  HPI  Patient presents for yearly preventative medicine examination. He is a pleasant 57 year old male who  has a past medical history of DM (diabetes mellitus) ([place]), Gout, Hemorrhoids, Hyperlipidemia, and Hypertension.  Essential Hypertension - Controlled with Hyzaar 50-12.5 mg   Gout - Takes Allopurinol 300 mg daily. Denies any recent gout flares.   DM - has been diet controlled in the past  Lab Results  Component Value Date   HGBA1C 6.3 05/26/2016    All immunizations and health maintenance protocols were reviewed with the patient and needed orders were placed. Refuses pneumonia vaccination   Appropriate screening laboratory values were ordered for the patient including screening of hyperlipidemia, renal function and hepatic function.  Medication reconciliation,  past medical history, social history, problem list and allergies were reviewed in detail with the patient  Goals were established with regard to weight loss, exercise, and  diet in compliance with medications. He has a Building services engineer but has not been using it. Has not been eating healthy  Wt Readings from Last 3 Encounters:  09/26/17 263 lb (119.3 kg)  07/25/17 256 lb (116.1 kg)  02/22/17 256 lb (116.1 kg)    Interval history includes that of arthroscopy of right shoulder in May 2019.  He reports he is recovering well, but continues to have episodes of discomfort when he lifts objects   He is up to date on his colonoscopy. He does routine dental and visions screens.   He has no acute complaints.   Review of Systems  Constitutional: Negative.   HENT: Negative.   Eyes: Negative.   Respiratory: Negative.   Cardiovascular: Negative.   Gastrointestinal: Negative.   Endocrine: Negative.   Genitourinary: Negative.   Musculoskeletal: Negative.   Skin: Negative.   Allergic/Immunologic: Negative.   Neurological: Negative.    Hematological: Negative.   Psychiatric/Behavioral: Negative.   All other systems reviewed and are negative.  Past Medical History:  Diagnosis Date  . DM (diabetes mellitus) ([place])   . Gout   . Hemorrhoids   . Hyperlipidemia   . Hypertension     Social History   Socioeconomic History  . Marital status: Single    Spouse name: Not on file  . Number of children: Not on file  . Years of education: Not on file  . Highest education level: Not on file  Occupational History  . Not on file  Social Needs  . Financial resource strain: Not on file  . Food insecurity:    Worry: Not on file    Inability: Not on file  . Transportation needs:    Medical: Not on file    Non-medical: Not on file  Tobacco Use  . Smoking status: Never Smoker  . Smokeless tobacco: Never Used  Substance and Sexual Activity  . Alcohol use: No  . Drug use: No  . Sexual activity: Not on file  Lifestyle  . Physical activity:    Days per week: Not on file    Minutes per session: Not on file  . Stress: Not on file  Relationships  . Social connections:    Talks on phone: Not on file    Gets together: Not on file    Attends religious service: Not on file    Active member of club or organization: Not on file    Attends meetings of clubs or  organizations: Not on file    Relationship status: Not on file  . Intimate partner violence:    Fear of current or ex partner: Not on file    Emotionally abused: Not on file    Physically abused: Not on file    Forced sexual activity: Not on file  Other Topics Concern  . Not on file  Social History Narrative   Married   Two step kids   7 grandchildren    2 great grand kids          Past Surgical History:  Procedure Laterality Date  . HERNIA REPAIR    . HIP ARTHRODESIS W/ ILIAC CREST BONE GRAFT    . KNEE ARTHROPLASTY Left     Family History  Problem Relation Age of Onset  . Diabetes Mother   . Hyperlipidemia Father   . Pancreatic cancer Father   .  Kidney failure Mother     No Known Allergies  No current outpatient medications on file prior to visit.   No current facility-administered medications on file prior to visit.     BP 134/82   Temp 98 F (36.7 C) (Oral)   Ht 5' 8.8" (1.748 m)   Wt 263 lb (119.3 kg)   BMI 39.06 kg/m       Objective:   Physical Exam  Constitutional: He is oriented to person, place, and time. He appears well-developed and well-nourished. No distress.  Obese    HENT:  Head: Normocephalic and atraumatic.  Right Ear: External ear normal.  Left Ear: External ear normal.  Nose: Nose normal.  Mouth/Throat: Oropharynx is clear and moist. No oropharyngeal exudate.  Eyes: Pupils are equal, round, and reactive to light. Conjunctivae and EOM are normal. Right eye exhibits no discharge. Left eye exhibits no discharge. No scleral icterus.  Neck: Normal range of motion. Neck supple. No JVD present. No tracheal deviation present. No thyromegaly present.  Cardiovascular: Normal rate, regular rhythm, normal heart sounds and intact distal pulses. Exam reveals no gallop and no friction rub.  No murmur heard. Pulmonary/Chest: Effort normal and breath sounds normal. No stridor. No respiratory distress. He has no wheezes. He has no rales. He exhibits no tenderness.  Abdominal: Soft. Bowel sounds are normal. He exhibits no distension and no mass. There is no tenderness. There is no rebound and no guarding. A hernia is present. Hernia confirmed positive in the ventral area.  Genitourinary:  Genitourinary Comments: Will do PSA   Musculoskeletal: Normal range of motion. He exhibits no edema, tenderness or deformity.  Lymphadenopathy:    He has no cervical adenopathy.  Neurological: He is alert and oriented to person, place, and time. He displays normal reflexes. No cranial nerve deficit or sensory deficit. He exhibits normal muscle tone. Coordination normal.  Skin: Skin is warm and dry. Capillary refill takes less than 2  seconds. No rash noted. He is not diaphoretic. No erythema. No pallor.  Psychiatric: He has a normal mood and affect. His behavior is normal. Judgment and thought content normal.  Nursing note and vitals reviewed.     Assessment & Plan:  1. Routine general medical examination at a health care facility - Encouraged lifestyle modifications  - Follow up in one year or sooner if needed  - Basic metabolic panel - CBC with Differential/Platelet - Hemoglobin A1c - Hepatic function panel - Lipid panel - TSH  2. Prostate cancer (HCC)  - PSA  3. Acute idiopathic gout of right foot  - allopurinol (  ZYLOPRIM) 300 MG tablet; TAKE ONE TABLET BY MOUTH ONCE DAILY  Dispense: 90 tablet; Refill: 3  4. Essential hypertension - Well controlled.  - No change in medications - Basic metabolic panel - CBC with Differential/Platelet - Hemoglobin A1c - Hepatic function panel - Lipid panel - TSH - losartan-hydrochlorothiazide (HYZAAR) 50-12.5 MG tablet; One half tab every morning for high blood pressure  Dispense: 90 tablet; Refill: 3  5. Type 2 diabetes mellitus with complication, without long-term current use of insulin ([place]) - Consider adding metformin  - needs to lose weight and start eating healthier  - Basic metabolic panel - CBC with Differential/Platelet - Hemoglobin A1c - Hepatic function panel - Lipid panel - TSH   Dorothyann Peng, NP

## 2017-10-03 ENCOUNTER — Telehealth: Payer: Self-pay | Admitting: Adult Health

## 2017-10-03 NOTE — Telephone Encounter (Signed)
Copied from [place] [id number]. Topic: Quick Communication - Rx Refill/Question >> Oct 03, 2017 11:13 AM Margot Ables wrote: Medication: allopurinol (ZYLOPRIM) 300 MG tablet  - pt states pharmacy told him they sent a fax to [facility] Saturday 09/29/17 requesting a possible change of med dose as the dose pt usually gets is on backorder - please f/u with pharmacy  Pt has 2 days left.  Has the patient contacted their pharmacy? yes Preferred Pharmacy (with phone number or street name): [facility] [address], [place] - [place] [phone] (Phone) [phone] (Fax)

## 2017-10-03 NOTE — Telephone Encounter (Signed)
Spoke to the pharmaacy.  Question was for losartan/hctz.  They were not able to get the combo pill.  Advised that they could split the medication for now and resume combo pill when received.  No further action at this time.

## 2018-01-01 ENCOUNTER — Ambulatory Visit: Payer: PRIVATE HEALTH INSURANCE | Admitting: Adult Health

## 2018-05-30 ENCOUNTER — Telehealth: Payer: Self-pay | Admitting: Adult Health

## 2018-05-30 NOTE — Telephone Encounter (Signed)
Copied from [place] [phone]. Topic: General - Other >> May 29, 2018 10:55 AM Antonieta Iba C wrote: Reason for CRM: pt called in to request a copy of his records in regards to his right shoulder pain. Pt says that he is completing some paperwork and need records.  Call pt and sent him a text it set up his mychart acct

## 2018-10-14 ENCOUNTER — Other Ambulatory Visit: Payer: Self-pay | Admitting: Adult Health

## 2018-10-14 DIAGNOSIS — M10071 Idiopathic gout, right ankle and foot: Secondary | ICD-10-CM

## 2018-10-15 ENCOUNTER — Encounter: Payer: Self-pay | Admitting: Family Medicine

## 2018-10-15 NOTE — Telephone Encounter (Signed)
Sent to the pharmacy by e-scribe for 90 days.  Letter released to MyChart. 

## 2019-03-04 ENCOUNTER — Other Ambulatory Visit: Payer: Self-pay | Admitting: Adult Health

## 2019-03-04 DIAGNOSIS — M10071 Idiopathic gout, right ankle and foot: Secondary | ICD-10-CM

## 2019-03-04 NOTE — Telephone Encounter (Signed)
Left message to return phone call.

## 2019-03-04 NOTE — Telephone Encounter (Signed)
Patient need to schedule an ov for more refills. 

## 2019-03-19 ENCOUNTER — Other Ambulatory Visit: Payer: Self-pay

## 2019-03-19 DIAGNOSIS — M10071 Idiopathic gout, right ankle and foot: Secondary | ICD-10-CM

## 2019-03-19 DIAGNOSIS — I10 Essential (primary) hypertension: Secondary | ICD-10-CM

## 2019-03-19 MED ORDER — LOSARTAN POTASSIUM 50 MG PO TABS: ORAL_TABLET | ORAL | 0 refills | Status: DC

## 2019-03-19 MED ORDER — LOSARTAN POTASSIUM-HCTZ 50-12.5 MG PO TABS: ORAL_TABLET | ORAL | 3 refills | Status: DC

## 2019-03-19 MED ORDER — ALLOPURINOL 300 MG PO TABS: ORAL_TABLET | ORAL | 0 refills | Status: DC

## 2019-05-27 ENCOUNTER — Other Ambulatory Visit: Payer: Self-pay

## 2019-05-28 ENCOUNTER — Ambulatory Visit (INDEPENDENT_AMBULATORY_CARE_PROVIDER_SITE_OTHER): Payer: PRIVATE HEALTH INSURANCE | Admitting: Adult Health

## 2019-05-28 ENCOUNTER — Encounter: Payer: Self-pay | Admitting: Adult Health

## 2019-05-28 VITALS — BP 128/86 | Temp 98.6°F | Ht 68.0 in | Wt 256.0 lb

## 2019-05-28 DIAGNOSIS — M10071 Idiopathic gout, right ankle and foot: Secondary | ICD-10-CM | POA: Diagnosis not present

## 2019-05-28 DIAGNOSIS — E118 Type 2 diabetes mellitus with unspecified complications: Secondary | ICD-10-CM

## 2019-05-28 DIAGNOSIS — Z0000 Encounter for general adult medical examination without abnormal findings: Secondary | ICD-10-CM

## 2019-05-28 DIAGNOSIS — I10 Essential (primary) hypertension: Secondary | ICD-10-CM

## 2019-05-28 DIAGNOSIS — C61 Malignant neoplasm of prostate: Secondary | ICD-10-CM

## 2019-05-28 LAB — LIPID PANEL
Cholesterol: 194 mg/dL (ref 0–200)
HDL: 38.9 mg/dL — ABNORMAL LOW (ref >39.00)
LDL Cholesterol: 128 mg/dL — ABNORMAL HIGH (ref 0–99)
NonHDL: 155.11
Total CHOL/HDL Ratio: 5
Triglycerides: 134 mg/dL (ref 0.0–149.0)
VLDL: 26.8 mg/dL (ref 0.0–40.0)

## 2019-05-28 LAB — COMPREHENSIVE METABOLIC PANEL
ALT: 26 U/L (ref 0–53)
AST: 19 U/L (ref 0–37)
Albumin: 4.4 g/dL (ref 3.5–5.2)
Alkaline Phosphatase: 95 U/L (ref 39–117)
BUN: 15 mg/dL (ref 6–23)
CO2: 29 mEq/L (ref 19–32)
Calcium: 9.3 mg/dL (ref 8.4–10.5)
Chloride: 104 mEq/L (ref 96–112)
Creatinine, Ser: 0.91 mg/dL (ref 0.40–1.50)
GFR: 85.57 mL/min (ref >60.00)
Glucose, Bld: 107 mg/dL — ABNORMAL HIGH (ref 70–99)
Potassium: 4.4 mEq/L (ref 3.5–5.1)
Sodium: 139 mEq/L (ref 135–145)
Total Bilirubin: 0.8 mg/dL (ref 0.2–1.2)
Total Protein: 7 g/dL (ref 6.0–8.3)

## 2019-05-28 LAB — CBC WITH DIFFERENTIAL/PLATELET
Basophils Absolute: 0.1 10*3/uL (ref 0.0–0.1)
Basophils Relative: 0.8 % (ref 0.0–3.0)
Eosinophils Absolute: 0.2 10*3/uL (ref 0.0–0.7)
Eosinophils Relative: 2.7 % (ref 0.0–5.0)
HCT: 41.6 % (ref 39.0–52.0)
Hemoglobin: 14 g/dL (ref 13.0–17.0)
Lymphocytes Relative: 30.8 % (ref 12.0–46.0)
Lymphs Abs: 2.7 10*3/uL (ref 0.7–4.0)
MCHC: 33.5 g/dL (ref 30.0–36.0)
MCV: 88.4 fl (ref 78.0–100.0)
Monocytes Absolute: 0.8 10*3/uL (ref 0.1–1.0)
Monocytes Relative: 8.8 % (ref 3.0–12.0)
Neutro Abs: 5 10*3/uL (ref 1.4–7.7)
Neutrophils Relative %: 56.9 % (ref 43.0–77.0)
Platelets: 200 10*3/uL (ref 150.0–400.0)
RBC: 4.71 Mil/uL (ref 4.22–5.81)
RDW: 13.9 % (ref 11.5–15.5)
WBC: 8.8 10*3/uL (ref 4.0–10.5)

## 2019-05-28 LAB — TSH: TSH: 3.06 u[IU]/mL (ref 0.35–4.50)

## 2019-05-28 LAB — PSA: PSA: 0.2 ng/mL (ref 0.10–4.00)

## 2019-05-28 LAB — HEMOGLOBIN A1C: Hgb A1c MFr Bld: 6.8 % — ABNORMAL HIGH (ref 4.6–6.5)

## 2019-05-28 NOTE — Progress Notes (Signed)
Subjective:    Patient ID: Dylan Estrada, male    DOB: 1960/07/04, 59 y.o.   MRN: [id number]  HPI  Patient presents for yearly preventative medicine examination. He is a pleasant 59 year old male who  has a past medical history of DM (diabetes mellitus) ([place]), Gout, Hemorrhoids, Hyperlipidemia, and Hypertension.  His last CPE was in 09/2017   Essential Hypertension -currently controlled with Hyzaar 50-12.5 mg.  He denies dizziness, lightheadedness, chest pain, or shortness of breath   Gout - Takes Allopurinol 300 mg daily. He denies any recent gout flares.   DM -he has been diet controlled in the past.  His last A1c in August 2019 was 6.7.  He was advised to follow-up in 3 months for recheck but failed to do so.  All immunizations and health maintenance protocols were reviewed with the patient and needed orders were placed. Refuses pneumonia vaccination today   Appropriate screening laboratory values were ordered for the patient including screening of hyperlipidemia, renal function and hepatic function. If indicated by BPH, a PSA was ordered.  Medication reconciliation,  past medical history, social history, problem list and allergies were reviewed in detail with the patient  Goals were established with regard to weight loss, exercise, and  diet in compliance with medications. He has been watching his diet but has not been exercising.  Wt Readings from Last 3 Encounters:  05/28/19 256 lb (116.1 kg)  09/26/17 263 lb (119.3 kg)  07/25/17 256 lb (116.1 kg)   He is up to date on screening colonoscopy. He is due for eye exam  Review of Systems  Constitutional: Negative.   HENT: Negative.   Eyes: Negative.   Respiratory: Negative.   Cardiovascular: Negative.   Gastrointestinal: Negative.   Endocrine: Negative.   Genitourinary: Negative.   Musculoskeletal: Negative.   Skin: Negative.   Allergic/Immunologic: Negative.   Neurological: Negative.   Hematological: Negative.    Psychiatric/Behavioral: Negative.   All other systems reviewed and are negative.  Past Medical History:  Diagnosis Date  . DM (diabetes mellitus) ([place])   . Gout   . Hemorrhoids   . Hyperlipidemia   . Hypertension     Social History   Socioeconomic History  . Marital status: Single    Spouse name: Not on file  . Number of children: Not on file  . Years of education: Not on file  . Highest education level: Not on file  Occupational History  . Not on file  Tobacco Use  . Smoking status: Never Smoker  . Smokeless tobacco: Never Used  Substance and Sexual Activity  . Alcohol use: No  . Drug use: No  . Sexual activity: Not on file  Other Topics Concern  . Not on file  Social History Narrative   Married   Two step kids   7 grandchildren    2 great grand kids         Social Determinants of Health   Financial Resource Strain:   . Difficulty of Paying Living Expenses:   Food Insecurity:   . Worried About Charity fundraiser in the Last Year:   . Arboriculturist in the Last Year:   Transportation Needs:   . Film/video editor (Medical):   Marland Kitchen Lack of Transportation (Non-Medical):   Physical Activity:   . Days of Exercise per Week:   . Minutes of Exercise per Session:   Stress:   . Feeling of Stress :   Social  Connections:   . Frequency of Communication with Friends and Family:   . Frequency of Social Gatherings with Friends and Family:   . Attends Religious Services:   . Active Member of Clubs or Organizations:   . Attends Archivist Meetings:   Marland Kitchen Marital Status:   Intimate Partner Violence:   . Fear of Current or Ex-Partner:   . Emotionally Abused:   Marland Kitchen Physically Abused:   . Sexually Abused:     Past Surgical History:  Procedure Laterality Date  . HERNIA REPAIR    . HIP ARTHRODESIS W/ ILIAC CREST BONE GRAFT    . KNEE ARTHROPLASTY Left     Family History  Problem Relation Age of Onset  . Diabetes Mother   . Hyperlipidemia Father   .  Pancreatic cancer Father   . Kidney failure Mother     No Known Allergies  Current Outpatient Medications on File Prior to Visit  Medication Sig Dispense Refill  . allopurinol (ZYLOPRIM) 300 MG tablet Take 1 tablet by mouth once daily 90 tablet 0  . losartan (COZAAR) 50 MG tablet TAKE 1/2 (ONE-HALF) TABLET BY MOUTH IN THE MORNING FOR HIGH BLOOD PRESSURE 135 tablet 0  . losartan-hydrochlorothiazide (HYZAAR) 50-12.5 MG tablet One half tab every morning for high blood pressure 90 tablet 3   No current facility-administered medications on file prior to visit.    BP 128/86   Temp 98.6 F (37 C)   Ht 5\' 8"  (1.727 m)   Wt 256 lb (116.1 kg)   BMI 38.92 kg/m       Objective:   Physical Exam Vitals and nursing note reviewed.  Constitutional:      General: He is not in acute distress.    Appearance: Normal appearance. He is well-developed and normal weight.  HENT:     Head: Normocephalic and atraumatic.     Right Ear: Tympanic membrane, ear canal and external ear normal. There is no impacted cerumen.     Left Ear: Tympanic membrane, ear canal and external ear normal. There is no impacted cerumen.     Nose: Nose normal. No congestion or rhinorrhea.     Mouth/Throat:     Mouth: Mucous membranes are moist.     Pharynx: Oropharynx is clear. No oropharyngeal exudate or posterior oropharyngeal erythema.  Eyes:     General:        Right eye: No discharge.        Left eye: No discharge.     Extraocular Movements: Extraocular movements intact.     Conjunctiva/sclera: Conjunctivae normal.     Pupils: Pupils are equal, round, and reactive to light.  Neck:     Vascular: No carotid bruit.     Trachea: No tracheal deviation.  Cardiovascular:     Rate and Rhythm: Normal rate and regular rhythm.     Pulses: Normal pulses.     Heart sounds: Normal heart sounds. No murmur. No friction rub. No gallop.   Pulmonary:     Effort: Pulmonary effort is normal. No respiratory distress.     Breath  sounds: Normal breath sounds. No stridor. No wheezing, rhonchi or rales.  Chest:     Chest wall: No tenderness.  Abdominal:     General: Bowel sounds are normal. There is no distension.     Palpations: Abdomen is soft. There is no mass.     Tenderness: There is no abdominal tenderness. There is no right CVA tenderness, left CVA tenderness, guarding or  rebound.     Hernia: No hernia is present.  Musculoskeletal:        General: No swelling, tenderness, deformity or signs of injury. Normal range of motion.     Right lower leg: No edema.     Left lower leg: No edema.  Lymphadenopathy:     Cervical: No cervical adenopathy.  Skin:    General: Skin is warm and dry.     Capillary Refill: Capillary refill takes less than 2 seconds.     Coloration: Skin is not jaundiced or pale.     Findings: No bruising, erythema, lesion or rash.  Neurological:     General: No focal deficit present.     Mental Status: He is alert and oriented to person, place, and time.     Cranial Nerves: No cranial nerve deficit.     Sensory: No sensory deficit.     Motor: No weakness.     Coordination: Coordination normal.     Gait: Gait normal.     Deep Tendon Reflexes: Reflexes normal.  Psychiatric:        Mood and Affect: Mood normal.        Behavior: Behavior normal.        Thought Content: Thought content normal.        Judgment: Judgment normal.       Assessment & Plan:  1. Routine general medical examination at a health care facility - encouraged weight loss. Needs to start exercising.  - Follow up in one year or sooner if needed - CBC with Differential/Platelet - Comprehensive metabolic panel - Hemoglobin A1c - Lipid panel - TSH  2. Essential hypertension - No change in medications.  - CBC with Differential/Platelet - Comprehensive metabolic panel - Hemoglobin A1c - Lipid panel - TSH  3. Acute idiopathic gout of right foot - Continue with allopurinol  - CBC with Differential/Platelet -  Comprehensive metabolic panel - Hemoglobin A1c - Lipid panel - TSH  4. Prostate cancer (HCC)  - PSA  5. Type 2 diabetes mellitus with complication, without long-term current use of insulin (HCC) - Consider metformin  3-6 month follow up  - CBC with Differential/Platelet - Comprehensive metabolic panel - Hemoglobin A1c - Lipid panel - TSH   Dorothyann Peng, NP

## 2019-05-28 NOTE — Patient Instructions (Signed)
It as great seeing you today   We will follow up with you regarding your blood work   Please work on weight loss through diet and exercise

## 2019-06-03 ENCOUNTER — Other Ambulatory Visit: Payer: Self-pay | Admitting: Family Medicine

## 2019-06-03 DIAGNOSIS — M10071 Idiopathic gout, right ankle and foot: Secondary | ICD-10-CM

## 2019-06-03 MED ORDER — METFORMIN HCL 500 MG PO TABS: 500.0000 mg | ORAL_TABLET | Freq: Every day | ORAL | 0 refills | Status: DC

## 2019-06-03 MED ORDER — ALLOPURINOL 300 MG PO TABS: ORAL_TABLET | ORAL | 3 refills | Status: DC

## 2019-06-03 MED ORDER — ATORVASTATIN CALCIUM 10 MG PO TABS: 10.0000 mg | ORAL_TABLET | Freq: Every day | ORAL | 3 refills | Status: DC

## 2019-06-03 MED ORDER — LOSARTAN POTASSIUM 50 MG PO TABS: ORAL_TABLET | ORAL | 3 refills | Status: DC

## 2019-08-27 ENCOUNTER — Encounter: Payer: Self-pay | Admitting: Adult Health

## 2019-08-27 ENCOUNTER — Ambulatory Visit (INDEPENDENT_AMBULATORY_CARE_PROVIDER_SITE_OTHER): Payer: PRIVATE HEALTH INSURANCE | Admitting: Adult Health

## 2019-08-27 ENCOUNTER — Other Ambulatory Visit: Payer: Self-pay

## 2019-08-27 VITALS — BP 120/80 | Temp 97.5°F | Wt 255.0 lb

## 2019-08-27 DIAGNOSIS — E118 Type 2 diabetes mellitus with unspecified complications: Secondary | ICD-10-CM | POA: Diagnosis not present

## 2019-08-27 LAB — POCT GLYCOSYLATED HEMOGLOBIN (HGB A1C): HbA1c, POC (controlled diabetic range): 6.3 % (ref 0.0–7.0)

## 2019-08-27 MED ORDER — EMPAGLIFLOZIN 10 MG PO TABS: 10.0000 mg | ORAL_TABLET | Freq: Every day | ORAL | 0 refills | Status: DC

## 2019-08-27 NOTE — Patient Instructions (Signed)
It was great seeing you today   I am going to place you on Jardiance 10 mg - let me know how much it costs you   You can stop Metformin   Follow up in 3 months

## 2019-08-27 NOTE — Progress Notes (Signed)
Subjective:    Patient ID: Dylan Estrada, male    DOB: 1960-11-18, 59 y.o.   MRN: [id number]  HPI 59 year old male who  has a past medical history of DM (diabetes mellitus) ([place]), Gout, Hemorrhoids, Hyperlipidemia, and Hypertension.  He presents to the office today for 65-month follow-up regarding diabetes mellitus.  In the past he has been diet controlled.  During his physical in April his A1c had increased to 6.8 and the decision was made to start him on Metformin 500 mg twice daily and he was advised of lifestyle modifications.   Since starting Metformin he has not been countered any side effects such as diarrhea, nausea, abdominal pain.  Wt Readings from Last 3 Encounters:  08/27/19 255 lb (115.7 kg)  05/28/19 256 lb (116.1 kg)  09/26/17 263 lb (119.3 kg)   He has been eating healthier but does not do not exercise outside of work. He is actively trying to lose weight but has not noticed much of a difference.   Review of Systems See HPI   Past Medical History:  Diagnosis Date   DM (diabetes mellitus) ([place])    Gout    Hemorrhoids    Hyperlipidemia    Hypertension     Social History   Socioeconomic History   Marital status: Single    Spouse name: Not on file   Number of children: Not on file   Years of education: Not on file   Highest education level: Not on file  Occupational History   Not on file  Tobacco Use   Smoking status: Never Smoker   Smokeless tobacco: Never Used  Substance and Sexual Activity   Alcohol use: No   Drug use: No   Sexual activity: Not on file  Other Topics Concern   Not on file  Social History Narrative   Married   Two step kids   7 grandchildren    2 great grand kids         Social Determinants of Health   Financial Resource Strain:    Difficulty of Paying Living Expenses:   Food Insecurity:    Worried About Charity fundraiser in the Last Year:    Arboriculturist in the Last Year:   Transportation Needs:      Film/video editor (Medical):    Lack of Transportation (Non-Medical):   Physical Activity:    Days of Exercise per Week:    Minutes of Exercise per Session:   Stress:    Feeling of Stress :   Social Connections:    Frequency of Communication with Friends and Family:    Frequency of Social Gatherings with Friends and Family:    Attends Religious Services:    Active Member of Clubs or Organizations:    Attends Music therapist:    Marital Status:   Intimate Partner Violence:    Fear of Current or Ex-Partner:    Emotionally Abused:    Physically Abused:    Sexually Abused:     Past Surgical History:  Procedure Laterality Date   HERNIA REPAIR     HIP ARTHRODESIS W/ ILIAC CREST BONE GRAFT     KNEE ARTHROPLASTY Left     Family History  Problem Relation Age of Onset   Diabetes Mother    Kidney failure Mother    Hyperlipidemia Father    Pancreatic cancer Father     No Known Allergies  Current Outpatient Medications on File Prior  to Visit  Medication Sig Dispense Refill   allopurinol (ZYLOPRIM) 300 MG tablet Take 1 tablet by mouth once daily 90 tablet 3   atorvastatin (LIPITOR) 10 MG tablet Take 1 tablet (10 mg total) by mouth daily. 90 tablet 3   losartan (COZAAR) 50 MG tablet TAKE 1/2 (ONE-HALF) TABLET BY MOUTH IN THE MORNING FOR HIGH BLOOD PRESSURE 45 tablet 3   metFORMIN (GLUCOPHAGE) 500 MG tablet Take 1 tablet (500 mg total) by mouth daily with breakfast. 90 tablet 0   No current facility-administered medications on file prior to visit.    BP 120/80    Temp (!) 97.5 F (36.4 C) (Temporal)    Wt 255 lb (115.7 kg)    BMI 38.77 kg/m       Objective:   Physical Exam Vitals and nursing note reviewed.  Constitutional:      Appearance: Normal appearance. He is obese.  Cardiovascular:     Rate and Rhythm: Normal rate and regular rhythm.     Pulses: Normal pulses.     Heart sounds: Normal heart sounds.  Pulmonary:      Effort: Pulmonary effort is normal.     Breath sounds: Normal breath sounds.  Abdominal:     General: Abdomen is flat.     Palpations: Abdomen is soft.  Skin:    General: Skin is warm and dry.     Capillary Refill: Capillary refill takes less than 2 seconds.  Neurological:     General: No focal deficit present.     Mental Status: He is alert and oriented to person, place, and time.  Psychiatric:        Mood and Affect: Mood normal.        Behavior: Behavior normal.        Thought Content: Thought content normal.        Judgment: Judgment normal.       Assessment & Plan:  1. Type 2 diabetes mellitus with complication, without long-term current use of insulin (HCC)  - POCT A1C - 6.3 -his A1c has improved and is now at goal.  I discussed switching him to [facility] as this is considered a generic medication with his insurance plan.  Believe Jardiance will help him in the weight loss journey but he does need to start exercising more frequently.  He is okay with sending in the medication and seeing how much it costs.  If the cost is not prohibitive he will start this and stop Metformin.  Follow-up in 90 days or sooner if needed - empagliflozin (JARDIANCE) 10 MG TABS tablet; Take 1 tablet (10 mg total) by mouth daily before breakfast.  Dispense: 90 tablet; Refill: 0  Dorothyann Peng, NP

## 2019-08-28 ENCOUNTER — Other Ambulatory Visit: Payer: Self-pay | Admitting: Adult Health

## 2019-08-28 MED ORDER — METFORMIN HCL 500 MG PO TABS: 500.0000 mg | ORAL_TABLET | Freq: Every day | ORAL | 0 refills | Status: DC

## 2019-08-29 ENCOUNTER — Other Ambulatory Visit: Payer: Self-pay | Admitting: Adult Health

## 2019-08-29 NOTE — Telephone Encounter (Signed)
SENT IN ON 08/28/2019 FOR 90 DAY SUPPLY.

## 2019-10-09 IMAGING — MR MR SHOULDER*R* W/O CM
4 of 5 series · 14 of 40 positions shown · non-contrast
Comparison: None.

CLINICAL DATA: Right shoulder pain, limited range of motion

EXAM:
MRI OF THE RIGHT SHOULDER WITHOUT CONTRAST
TECHNIQUE: Multiplanar, multisequence MR imaging of the shoulder was performed.
No intravenous contrast was administered.

[Series 6: T2 fat-sat · axial · right · 3.0mm · 0.44mm/px · z∈[-8,+67]mm · 3 of 30 slices shown (1 of 3)]
[im 4/30]
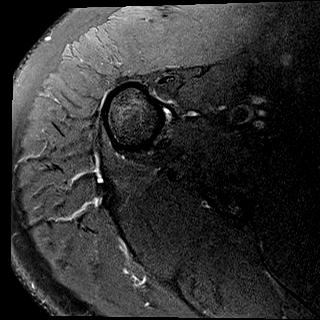
[im 17/30]
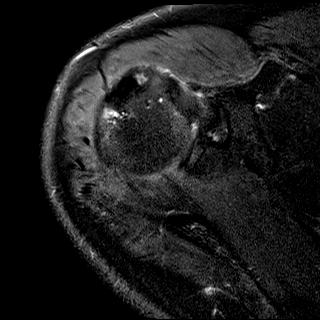
[im 26/30]
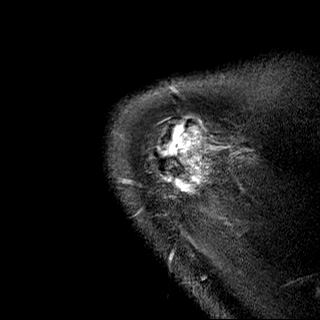

[Series 7: T2 fat-sat · oblique · right · 3.0mm · 0.44mm/px · 3 of 24 slices shown (2 of 3)]
[im 4/24]
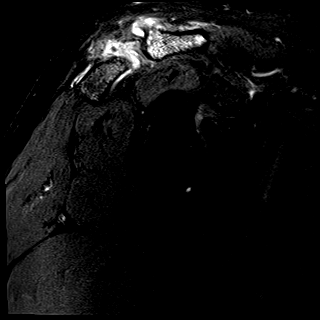
[im 14/24]
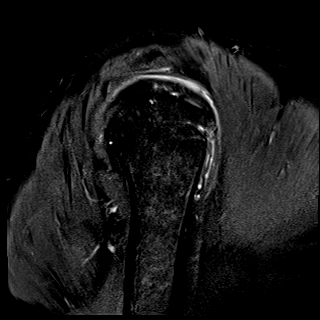
[im 20/24]
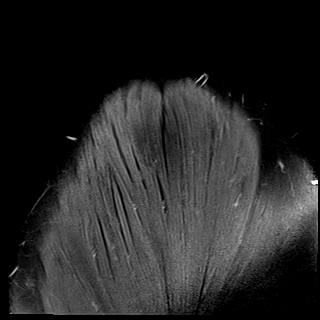

[Series 9: PD · oblique · right · 3.0mm · 0.18mm/px · 5 of 21 slices shown]
[im 1/21]
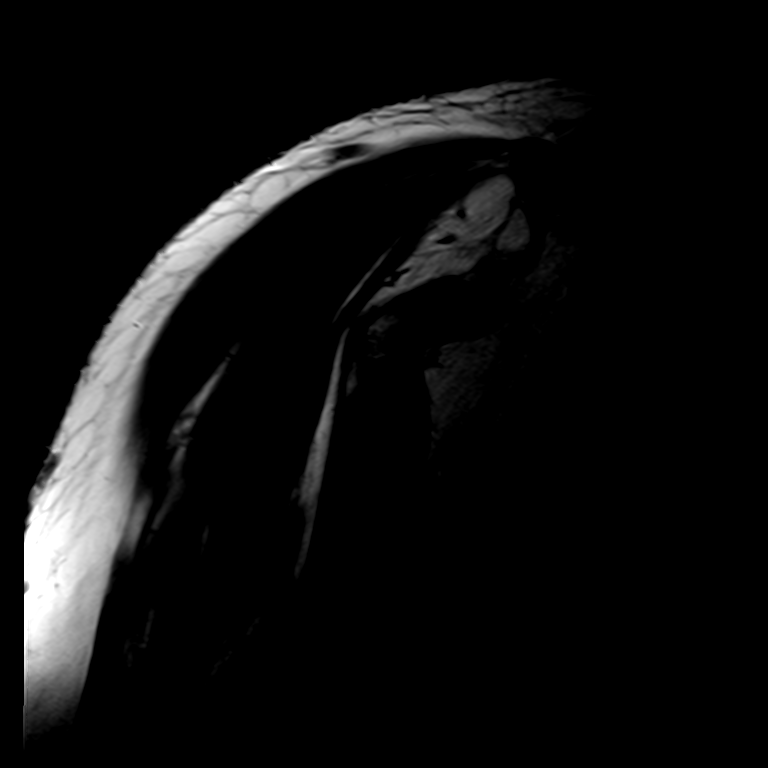
[im 4/21]
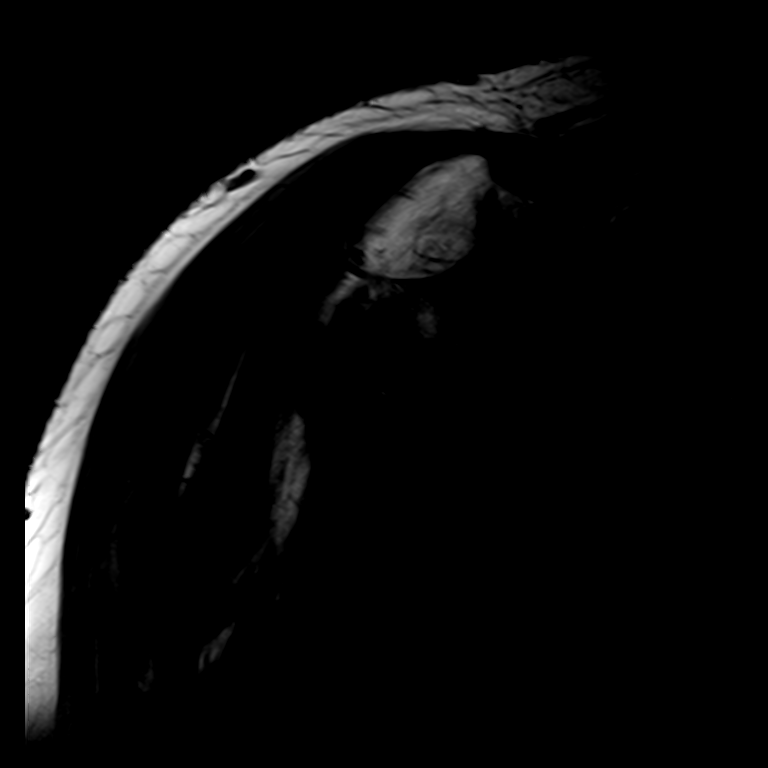
[im 7/21]
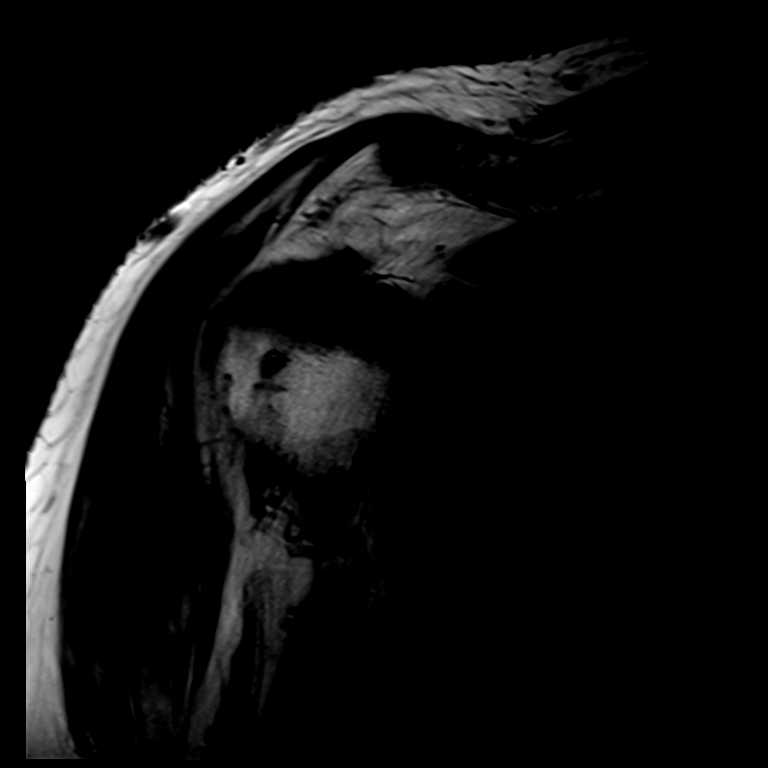
[im 11/21]
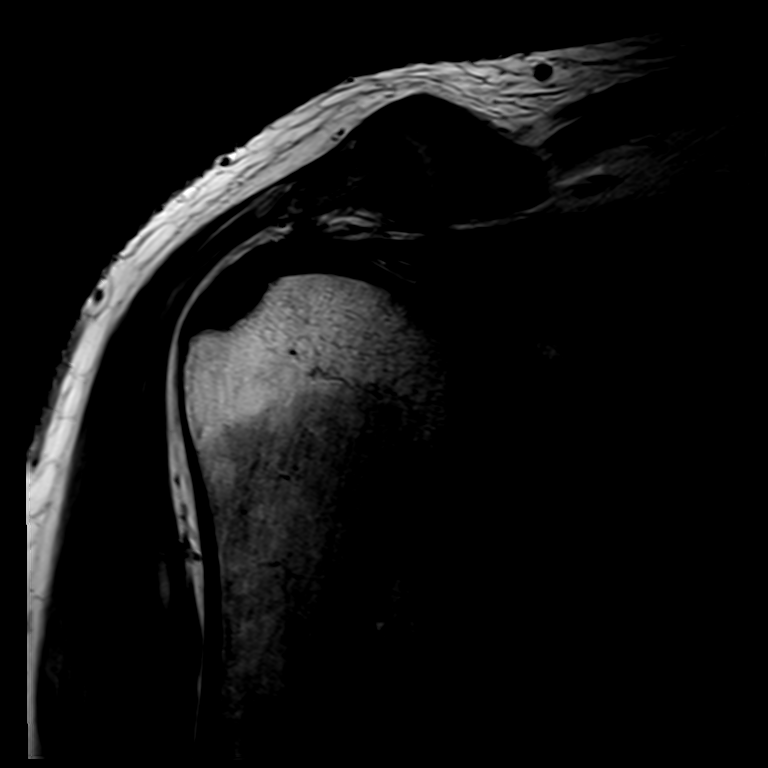
[im 17/21]
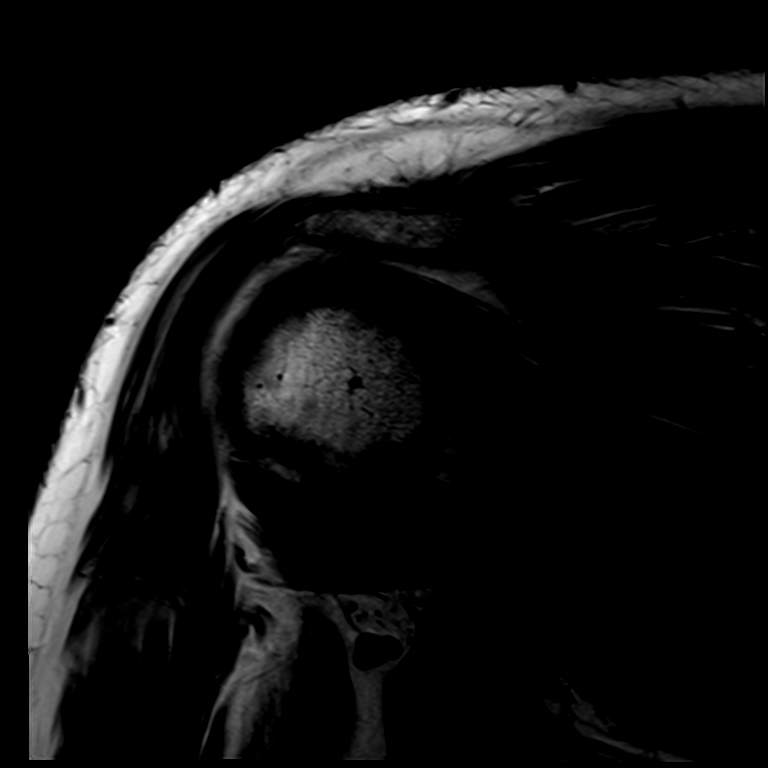

[Series 10: T2 fat-sat · oblique · right · 3.0mm · 0.22mm/px · 3 of 21 slices shown (3 of 3)]
[im 4/21]
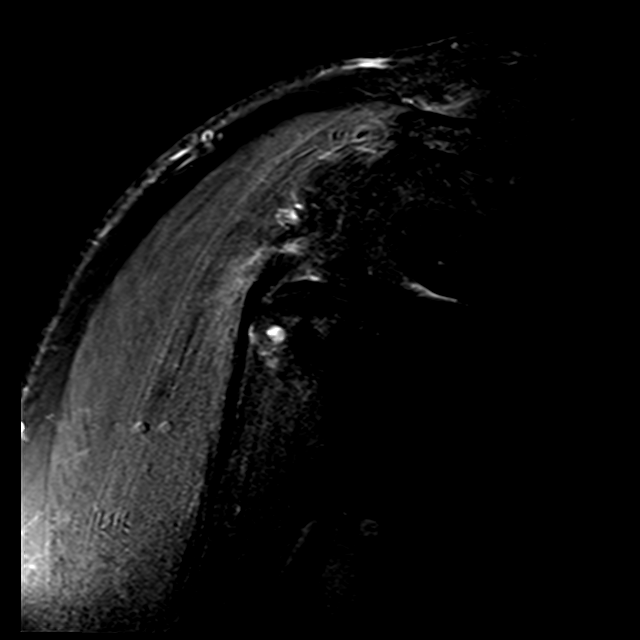
[im 11/21]
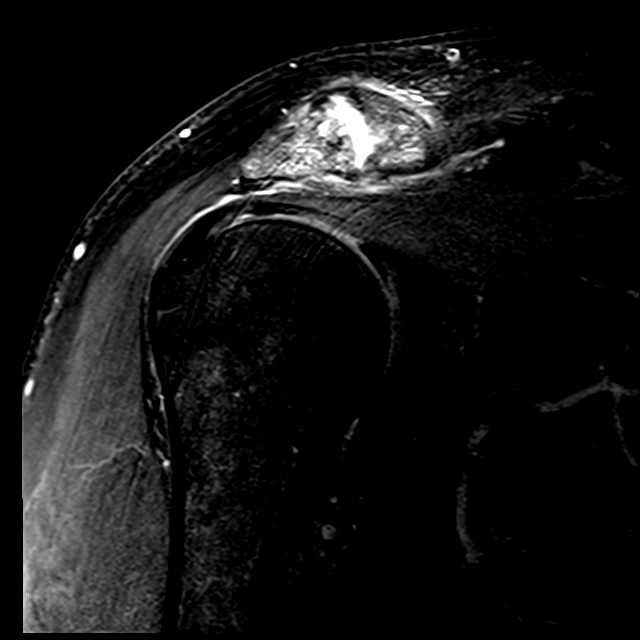
[im 17/21]
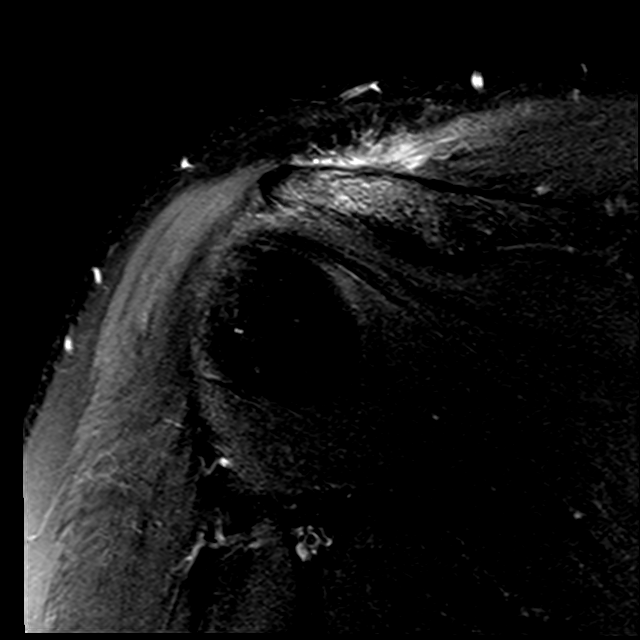

[14 of 40 positions shown; findings below may reference images not displayed]

FINDINGS: Rotator cuff: Moderate tendinosis of the supraspinatus tendon with a
insertional interstitial tear of the anterior aspect with possible
bursal surface extension. Moderate tendinosis of the infraspinatus
tendon with a insertional interstitial tear. Teres minor tendon is
intact. Subscapularis tendon is intact.

Muscles: No atrophy or fatty replacement of nor abnormal signal
within, the muscles of the rotator cuff.

Biceps long head:  Intact.

Acromioclavicular Joint: Severe arthropathy of the acromioclavicular
joint with small effusion of the AC joint and severe surrounding
marrow edema on either side of the joint. Type II acromion. Small
amount of subacromial/subdeltoid bursal fluid.

Glenohumeral Joint: No joint effusion.  No chondral defect.

Labrum: Limited evaluation of the labrum secondary lack of
intra-articular fluid. Small anterior superior labral tear with a 3
mm paralabral cyst.

Bones:  No acute osseous abnormality.  No aggressive osseous lesion.

Other: No fluid collection or hematoma.
IMPRESSION: 1. Moderate tendinosis of the supraspinatus tendon with a
insertional interstitial tear of the anterior aspect with possible
bursal surface extension.
2. Moderate tendinosis of the infraspinatus tendon with a
insertional interstitial tear.
3. Severe arthropathy of the acromioclavicular joint with small
effusion of the AC joint and severe surrounding marrow edema on
either side of the joint. Mild subacromial/subdeltoid bursitis.

## 2019-11-26 ENCOUNTER — Ambulatory Visit (INDEPENDENT_AMBULATORY_CARE_PROVIDER_SITE_OTHER): Payer: PRIVATE HEALTH INSURANCE | Admitting: Adult Health

## 2019-11-26 ENCOUNTER — Other Ambulatory Visit: Payer: Self-pay

## 2019-11-26 ENCOUNTER — Encounter: Payer: Self-pay | Admitting: Adult Health

## 2019-11-26 VITALS — BP 122/70 | HR 62 | Temp 97.9°F | Resp 12 | Ht 68.0 in | Wt 259.6 lb

## 2019-11-26 DIAGNOSIS — E118 Type 2 diabetes mellitus with unspecified complications: Secondary | ICD-10-CM | POA: Diagnosis not present

## 2019-11-26 LAB — POCT GLYCOSYLATED HEMOGLOBIN (HGB A1C): Hemoglobin A1C: 6.5 % — AB (ref 4.0–5.6)

## 2019-11-26 MED ORDER — METFORMIN HCL 500 MG PO TABS: 500.0000 mg | ORAL_TABLET | Freq: Every day | ORAL | 1 refills | Status: DC

## 2019-11-26 NOTE — Progress Notes (Signed)
Subjective:    Patient ID: Dylan Estrada, male    DOB: [birth year], 59 y.o.   MRN: [id number]  HPI 59 year old male who  has a past medical history of DM (diabetes mellitus) ([place]), Gout, Hemorrhoids, Hyperlipidemia, and Hypertension.  He presents today for 58-month follow-up regarding diabetes mellitus.  He is currently on Metformin 500 mg twice daily.  Denies side effects of metformin.  He does not monitor his blood sugars at home.  He has not changed much in his lifestyle.  He does stay active with bowling but does not get much exercise outside of that.  Sweets sound like they continue to be an issue.  Lab Results  Component Value Date   HGBA1C 6.3 08/27/2019   Wt Readings from Last 3 Encounters:  11/26/19 259 lb 9.6 oz (117.8 kg)  08/27/19 255 lb (115.7 kg)  05/28/19 256 lb (116.1 kg)   Review of Systems See HPI   Past Medical History:  Diagnosis Date  . DM (diabetes mellitus) ([place])   . Gout   . Hemorrhoids   . Hyperlipidemia   . Hypertension     Social History   Socioeconomic History  . Marital status: Single    Spouse name: Not on file  . Number of children: Not on file  . Years of education: Not on file  . Highest education level: Not on file  Occupational History  . Not on file  Tobacco Use  . Smoking status: Never Smoker  . Smokeless tobacco: Never Used  Substance and Sexual Activity  . Alcohol use: No  . Drug use: No  . Sexual activity: Not on file  Other Topics Concern  . Not on file  Social History Narrative   Married   Two step kids   7 grandchildren    2 great grand kids         Social Determinants of Health   Financial Resource Strain:   . Difficulty of Paying Living Expenses: Not on file  Food Insecurity:   . Worried About Charity fundraiser in the Last Year: Not on file  . Ran Out of Food in the Last Year: Not on file  Transportation Needs:   . Lack of Transportation (Medical): Not on file  . Lack of Transportation (Non-Medical): Not  on file  Physical Activity:   . Days of Exercise per Week: Not on file  . Minutes of Exercise per Session: Not on file  Stress:   . Feeling of Stress : Not on file  Social Connections:   . Frequency of Communication with Friends and Family: Not on file  . Frequency of Social Gatherings with Friends and Family: Not on file  . Attends Religious Services: Not on file  . Active Member of Clubs or Organizations: Not on file  . Attends Archivist Meetings: Not on file  . Marital Status: Not on file  Intimate Partner Violence:   . Fear of Current or Ex-Partner: Not on file  . Emotionally Abused: Not on file  . Physically Abused: Not on file  . Sexually Abused: Not on file    Past Surgical History:  Procedure Laterality Date  . HERNIA REPAIR    . HIP ARTHRODESIS W/ ILIAC CREST BONE GRAFT    . KNEE ARTHROPLASTY Left     Family History  Problem Relation Age of Onset  . Diabetes Mother   . Kidney failure Mother   . Hyperlipidemia Father   . Pancreatic  cancer Father     No Known Allergies  Current Outpatient Medications on File Prior to Visit  Medication Sig Dispense Refill  . allopurinol (ZYLOPRIM) 300 MG tablet Take 1 tablet by mouth once daily 90 tablet 3  . atorvastatin (LIPITOR) 10 MG tablet Take 1 tablet (10 mg total) by mouth daily. 90 tablet 3  . losartan (COZAAR) 50 MG tablet TAKE 1/2 (ONE-HALF) TABLET BY MOUTH IN THE MORNING FOR HIGH BLOOD PRESSURE 45 tablet 3  . metFORMIN (GLUCOPHAGE) 500 MG tablet Take 1 tablet (500 mg total) by mouth daily with breakfast. 90 tablet 0   No current facility-administered medications on file prior to visit.    BP 122/70 (BP Location: Left Arm, Patient Position: Sitting, Cuff Size: Large)   Pulse 62   Temp 97.9 F (36.6 C) (Oral)   Resp 12   Ht 5\' 8"  (1.727 m)   Wt 259 lb 9.6 oz (117.8 kg)   SpO2 97%   BMI 39.47 kg/m       Objective:   Physical Exam Constitutional:      Appearance: Normal appearance. He is obese.    Cardiovascular:     Rate and Rhythm: Normal rate and regular rhythm.     Pulses: Normal pulses.     Heart sounds: Normal heart sounds.  Pulmonary:     Effort: Pulmonary effort is normal.     Breath sounds: Normal breath sounds.  Musculoskeletal:        General: Normal range of motion.  Skin:    General: Skin is warm and dry.     Capillary Refill: Capillary refill takes less than 2 seconds.  Neurological:     General: No focal deficit present.     Mental Status: He is alert and oriented to person, place, and time.  Psychiatric:        Mood and Affect: Mood normal.        Behavior: Behavior normal.        Thought Content: Thought content normal.        Judgment: Judgment normal.       Assessment & Plan:  1. Type 2 diabetes mellitus with complication, without long-term current use of insulin (HCC)  - POC HgB A1c- 6.5 -has increased slightly but still under goal of 7.0.  We will keep him on Metformin 500 mg daily with a follow-up in 6 months.  He was encouraged lifestyle modifications - metFORMIN (GLUCOPHAGE) 500 MG tablet; Take 1 tablet (500 mg total) by mouth daily with breakfast.  Dispense: 90 tablet; Refill: 1   Dorothyann Peng, NP

## 2020-05-25 ENCOUNTER — Other Ambulatory Visit: Payer: Self-pay

## 2020-05-26 ENCOUNTER — Ambulatory Visit (INDEPENDENT_AMBULATORY_CARE_PROVIDER_SITE_OTHER): Payer: PRIVATE HEALTH INSURANCE | Admitting: Adult Health

## 2020-05-26 VITALS — BP 130/80 | HR 66 | Temp 98.1°F | Resp 18 | Ht 68.0 in | Wt 256.6 lb

## 2020-05-26 DIAGNOSIS — M10071 Idiopathic gout, right ankle and foot: Secondary | ICD-10-CM

## 2020-05-26 DIAGNOSIS — I10 Essential (primary) hypertension: Secondary | ICD-10-CM

## 2020-05-26 DIAGNOSIS — E118 Type 2 diabetes mellitus with unspecified complications: Secondary | ICD-10-CM

## 2020-05-26 DIAGNOSIS — R5383 Other fatigue: Secondary | ICD-10-CM

## 2020-05-26 DIAGNOSIS — E782 Mixed hyperlipidemia: Secondary | ICD-10-CM

## 2020-05-26 DIAGNOSIS — Z0000 Encounter for general adult medical examination without abnormal findings: Secondary | ICD-10-CM | POA: Diagnosis not present

## 2020-05-26 DIAGNOSIS — Z125 Encounter for screening for malignant neoplasm of prostate: Secondary | ICD-10-CM | POA: Diagnosis not present

## 2020-05-26 MED ORDER — LOSARTAN POTASSIUM 25 MG PO TABS: 25.0000 mg | ORAL_TABLET | Freq: Every day | ORAL | 3 refills | Status: DC

## 2020-05-26 MED ORDER — METFORMIN HCL 500 MG PO TABS: 500.0000 mg | ORAL_TABLET | Freq: Every day | ORAL | 1 refills | Status: DC

## 2020-05-26 MED ORDER — ALLOPURINOL 300 MG PO TABS: ORAL_TABLET | ORAL | 3 refills | Status: DC

## 2020-05-26 MED ORDER — ATORVASTATIN CALCIUM 10 MG PO TABS: 10.0000 mg | ORAL_TABLET | Freq: Every day | ORAL | 3 refills | Status: DC

## 2020-05-26 NOTE — Patient Instructions (Signed)
It was great seeing you today   I will follow up with you regarding your blood work   I will send in all of your medications   Please plan on 6 month follow up   Health Maintenance, Male A healthy lifestyle and preventative care can promote health and wellness.  Maintain regular health, dental, and eye exams.  Eat a healthy diet. Foods like vegetables, fruits, whole grains, low-fat dairy products, and lean protein foods contain the nutrients you need and are low in calories. Decrease your intake of foods high in solid fats, added sugars, and salt. Get information about a proper diet from your health care provider, if necessary.  Regular physical exercise is one of the most important things you can do for your health. Most adults should get at least 150 minutes of moderate-intensity exercise (any activity that increases your heart rate and causes you to sweat) each week. In addition, most adults need muscle-strengthening exercises on 2 or more days a week.   Maintain a healthy weight. The body mass index (BMI) is a screening tool to identify possible weight problems. It provides an estimate of body fat based on height and weight. Your health care provider can find your BMI and can help you achieve or maintain a healthy weight. For males 20 years and older:  A BMI below 18.5 is considered underweight.  A BMI of 18.5 to 24.9 is normal.  A BMI of 25 to 29.9 is considered overweight.  A BMI of 30 and above is considered obese.  Maintain normal blood lipids and cholesterol by exercising and minimizing your intake of saturated fat. Eat a balanced diet with plenty of fruits and vegetables. Blood tests for lipids and cholesterol should begin at age 8 and be repeated every 5 years. If your lipid or cholesterol levels are high, you are over age 67, or you are at high risk for heart disease, you may need your cholesterol levels checked more frequently.Ongoing high lipid and cholesterol levels  should be treated with medicines if diet and exercise are not working.  If you smoke, find out from your health care provider how to quit. If you do not use tobacco, do not start.  Lung cancer screening is recommended for adults aged 64-80 years who are at high risk for developing lung cancer because of a history of smoking. A yearly low-dose CT scan of the lungs is recommended for people who have at least a 30-pack-year history of smoking and are current smokers or have quit within the past 15 years. A pack year of smoking is smoking an average of 1 pack of cigarettes a day for 1 year (for example, a 30-pack-year history of smoking could mean smoking 1 pack a day for 30 years or 2 packs a day for 15 years). Yearly screening should continue until the smoker has stopped smoking for at least 15 years. Yearly screening should be stopped for people who develop a health problem that would prevent them from having lung cancer treatment.  If you choose to drink alcohol, do not have more than 2 drinks per day. One drink is considered to be 12 oz (360 mL) of beer, 5 oz (150 mL) of wine, or 1.5 oz (45 mL) of liquor.  Avoid the use of street drugs. Do not share needles with anyone. Ask for help if you need support or instructions about stopping the use of drugs.  High blood pressure causes heart disease and increases the risk of stroke.  High blood pressure is more likely to develop in:  People who have blood pressure in the end of the normal range (100-139/85-89 mm Hg).  People who are overweight or obese.  People who are African American.  If you are 21-77 years of age, have your blood pressure checked every 3-5 years. If you are 58 years of age or older, have your blood pressure checked every year. You should have your blood pressure measured twice--once when you are at a hospital or clinic, and once when you are not at a hospital or clinic. Record the average of the two measurements. To check your blood  pressure when you are not at a hospital or clinic, you can use:  An automated blood pressure machine at a pharmacy.  A home blood pressure monitor.  If you are 72-37 years old, ask your health care provider if you should take aspirin to prevent heart disease.  Diabetes screening involves taking a blood sample to check your fasting blood sugar level. This should be done once every 3 years after age 60 if you are at a normal weight and without risk factors for diabetes. Testing should be considered at a younger age or be carried out more frequently if you are overweight and have at least 1 risk factor for diabetes.  Colorectal cancer can be detected and often prevented. Most routine colorectal cancer screening begins at the age of 40 and continues through age 54. However, your health care provider may recommend screening at an earlier age if you have risk factors for colon cancer. On a yearly basis, your health care provider may provide home test kits to check for hidden blood in the stool. A small camera at the end of a tube may be used to directly examine the colon (sigmoidoscopy or colonoscopy) to detect the earliest forms of colorectal cancer. Talk to your health care provider about this at age 27 when routine screening begins. A direct exam of the colon should be repeated every 5-10 years through age 87, unless early forms of precancerous polyps or small growths are found.  People who are at an increased risk for hepatitis B should be screened for this virus. You are considered at high risk for hepatitis B if:  You were born in a country where hepatitis B occurs often. Talk with your health care provider about which countries are considered high risk.  Your parents were born in a high-risk country and you have not received a shot to protect against hepatitis B (hepatitis B vaccine).  You have HIV or AIDS.  You use needles to inject street drugs.  You live with, or have sex with, someone who  has hepatitis B.  You are a man who has sex with other men (MSM).  You get hemodialysis treatment.  You take certain medicines for conditions like cancer, organ transplantation, and autoimmune conditions.  Hepatitis C blood testing is recommended for all people born from [birth year] through 1964 and any individual with known risk factors for hepatitis C.  Healthy men should no longer receive prostate-specific antigen (PSA) blood tests as part of routine cancer screening. Talk to your health care provider about prostate cancer screening.  Testicular cancer screening is not recommended for adolescents or adult males who have no symptoms. Screening includes self-exam, a health care provider exam, and other screening tests. Consult with your health care provider about any symptoms you have or any concerns you have about testicular cancer.  Practice safe sex. Use condoms  and avoid high-risk sexual practices to reduce the spread of sexually transmitted infections (STIs).  You should be screened for STIs, including gonorrhea and chlamydia if:  You are sexually active and are younger than 24 years.  You are older than 24 years, and your health care provider tells you that you are at risk for this type of infection.  Your sexual activity has changed since you were last screened, and you are at an increased risk for chlamydia or gonorrhea. Ask your health care provider if you are at risk.  If you are at risk of being infected with HIV, it is recommended that you take a prescription medicine daily to prevent HIV infection. This is called pre-exposure prophylaxis (PrEP). You are considered at risk if:  You are a man who has sex with other men (MSM).  You are a heterosexual man who is sexually active with multiple partners.  You take drugs by injection.  You are sexually active with a partner who has HIV.  Talk with your health care provider about whether you are at high risk of being infected with  HIV. If you choose to begin PrEP, you should first be tested for HIV. You should then be tested every 3 months for as long as you are taking PrEP.  Use sunscreen. Apply sunscreen liberally and repeatedly throughout the day. You should seek shade when your shadow is shorter than you. Protect yourself by wearing long sleeves, pants, a wide-brimmed hat, and sunglasses year round whenever you are outdoors.  Tell your health care provider of new moles or changes in moles, especially if there is a change in shape or color. Also, tell your health care provider if a mole is larger than the size of a pencil eraser.  A one-time screening for abdominal aortic aneurysm (AAA) and surgical repair of large AAAs by ultrasound is recommended for men aged 64-75 years who are current or former smokers.  Stay current with your vaccines (immunizations).   This information is not intended to replace advice given to you by your health care provider. Make sure you discuss any questions you have with your health care provider.   Document Released: 08/05/2007 Document Revised: 02/27/2014 Document Reviewed: 07/04/2010 Elsevier Interactive Patient Education [employer].

## 2020-05-26 NOTE — Progress Notes (Signed)
Subjective:    Patient ID: Dylan Estrada, male    DOB: December 06, 1960, 60 y.o.   MRN: [id number]  HPI Patient presents for yearly preventative medicine examination. He is a pleasant 60 year old male who  has a past medical history of DM (diabetes mellitus) ([place]), Gout, Hemorrhoids, Hyperlipidemia, and Hypertension.  DM -currently prescribed Metformin 500 mg twice daily.  Does not check his blood sugars at home.  Lifestyle has not changed much and outside of bowling he does not get exercise.  Continues to indulge in sweets.  Lab Results  Component Value Date   HGBA1C 6.5 (A) 11/26/2019   HTN - prescribed Losartan 25 mg daily. Denies dizziness, lightheadedness, blurred vision, or headaches  BP Readings from Last 3 Encounters:  05/26/20 130/80  11/26/19 122/70  08/27/19 120/80   Hyperlipidemia - takes lipitor 10 mg. He denies myalgia   Lab Results  Component Value Date   CHOL 194 05/28/2019   HDL 38.90 (L) 05/28/2019   LDLCALC 128 (H) 05/28/2019   LDLDIRECT 129.2 12/18/2013   TRIG 134.0 05/28/2019   CHOLHDL 5 05/28/2019   Fatigue - He reports worsening fatigue. Chalks this up to working longer hours throughout the week. Will go home from work and " sleep hard for about 5 hours" and then is up. Does report snoring and possible apneic episodes. Feels fatigued during the day but not when he wakes up in the morning   Gout - well controlled with allopurinol 300 mg daily. No recent gout flares  All immunizations and health maintenance protocols were reviewed with the patient and needed orders were placed.  Appropriate screening laboratory values were ordered for the patient including screening of hyperlipidemia, renal function and hepatic function. If indicated by BPH, a PSA was ordered.  Medication reconciliation,  past medical history, social history, problem list and allergies were reviewed in detail with the patient  Goals were established with regard to weight loss, exercise, and   diet in compliance with medications. Is not exercising much outside of bowling.  Wt Readings from Last 3 Encounters:  05/26/20 256 lb 9.6 oz (116.4 kg)  11/26/19 259 lb 9.6 oz (117.8 kg)  08/27/19 255 lb (115.7 kg)   He is up to date on routine colon cancer screening. Had this at [facility]  Review of Systems  Constitutional: Positive for fatigue.  HENT: Negative.   Eyes: Negative.   Respiratory: Negative.   Cardiovascular: Negative.   Gastrointestinal: Negative.   Endocrine: Negative.   Genitourinary: Negative.   Musculoskeletal: Negative.   Skin: Negative.   Allergic/Immunologic: Negative.   Neurological: Negative.   Hematological: Negative.   Psychiatric/Behavioral: Negative.   All other systems reviewed and are negative.    Past Medical History:  Diagnosis Date  . DM (diabetes mellitus) ([place])   . Gout   . Hemorrhoids   . Hyperlipidemia   . Hypertension     Social History   Socioeconomic History  . Marital status: Single    Spouse name: Not on file  . Number of children: Not on file  . Years of education: Not on file  . Highest education level: Not on file  Occupational History  . Not on file  Tobacco Use  . Smoking status: Never Smoker  . Smokeless tobacco: Never Used  Substance and Sexual Activity  . Alcohol use: No  . Drug use: No  . Sexual activity: Not on file  Other Topics Concern  . Not on file  Social  History Narrative   Married   Two step kids   7 grandchildren    2 great grand kids         Social Determinants of Health   Financial Resource Strain: Not on file  Food Insecurity: Not on file  Transportation Needs: Not on file  Physical Activity: Not on file  Stress: Not on file  Social Connections: Not on file  Intimate Partner Violence: Not on file    Past Surgical History:  Procedure Laterality Date  . HERNIA REPAIR    . HIP ARTHRODESIS W/ ILIAC CREST BONE GRAFT    . KNEE ARTHROPLASTY Left     Family History  Problem Relation  Age of Onset  . Diabetes Mother   . Kidney failure Mother   . Hyperlipidemia Father   . Pancreatic cancer Father     No Known Allergies  Current Outpatient Medications on File Prior to Visit  Medication Sig Dispense Refill  . allopurinol (ZYLOPRIM) 300 MG tablet Take 1 tablet by mouth once daily 90 tablet 3  . atorvastatin (LIPITOR) 10 MG tablet Take 1 tablet (10 mg total) by mouth daily. 90 tablet 3  . losartan (COZAAR) 50 MG tablet TAKE 1/2 (ONE-HALF) TABLET BY MOUTH IN THE MORNING FOR HIGH BLOOD PRESSURE 45 tablet 3  . metFORMIN (GLUCOPHAGE) 500 MG tablet Take 1 tablet (500 mg total) by mouth daily with breakfast. 90 tablet 1   No current facility-administered medications on file prior to visit.    BP 130/80 (BP Location: Right Arm, Patient Position: Sitting, Cuff Size: Large)   Pulse 66   Temp 98.1 F (36.7 C) (Oral)   Resp 18   Ht 5\' 8"  (1.727 m)   Wt 256 lb 9.6 oz (116.4 kg)   SpO2 98%   BMI 39.02 kg/m       Objective:   Physical Exam Vitals and nursing note reviewed.  Constitutional:      General: He is not in acute distress.    Appearance: Normal appearance. He is well-developed. He is obese.  HENT:     Head: Normocephalic and atraumatic.     Right Ear: Tympanic membrane, ear canal and external ear normal. There is no impacted cerumen.     Left Ear: Tympanic membrane, ear canal and external ear normal. There is no impacted cerumen.     Nose: Nose normal. No congestion or rhinorrhea.     Mouth/Throat:     Mouth: Mucous membranes are moist.     Pharynx: Oropharynx is clear. No oropharyngeal exudate or posterior oropharyngeal erythema.  Eyes:     General:        Right eye: No discharge.        Left eye: No discharge.     Extraocular Movements: Extraocular movements intact.     Conjunctiva/sclera: Conjunctivae normal.     Pupils: Pupils are equal, round, and reactive to light.  Neck:     Vascular: No carotid bruit.     Trachea: No tracheal deviation.   Cardiovascular:     Rate and Rhythm: Normal rate and regular rhythm.     Pulses: Normal pulses.     Heart sounds: Normal heart sounds. No murmur heard. No friction rub. No gallop.   Pulmonary:     Effort: Pulmonary effort is normal. No respiratory distress.     Breath sounds: Normal breath sounds. No stridor. No wheezing, rhonchi or rales.  Chest:     Chest wall: No tenderness.  Abdominal:  General: Bowel sounds are normal. There is no distension.     Palpations: Abdomen is soft. There is no mass.     Tenderness: There is no abdominal tenderness. There is no right CVA tenderness, left CVA tenderness, guarding or rebound.     Hernia: No hernia is present.  Musculoskeletal:        General: No swelling, tenderness, deformity or signs of injury. Normal range of motion.     Right lower leg: No edema.     Left lower leg: No edema.  Lymphadenopathy:     Cervical: No cervical adenopathy.  Skin:    General: Skin is warm and dry.     Capillary Refill: Capillary refill takes less than 2 seconds.     Coloration: Skin is not jaundiced or pale.     Findings: No bruising, erythema, lesion or rash.  Neurological:     General: No focal deficit present.     Mental Status: He is alert and oriented to person, place, and time.     Cranial Nerves: No cranial nerve deficit.     Sensory: No sensory deficit.     Motor: No weakness.     Coordination: Coordination normal.     Gait: Gait normal.     Deep Tendon Reflexes: Reflexes normal.  Psychiatric:        Mood and Affect: Mood normal.        Behavior: Behavior normal.        Thought Content: Thought content normal.        Judgment: Judgment normal.       Assessment & Plan:  1. Routine general medical examination at a health care facility - Encouraged lifestyle modifications to help with weight loss  - Follow up in one year or sooner if needed - CBC with Differential/Platelet; Future - Comprehensive metabolic panel; Future - Lipid panel;  Future - PSA; Future - TSH; Future  2. Type 2 diabetes mellitus with complication, without long-term current use of insulin (HCC) - Likely 6 month follow up  - CBC with Differential/Platelet; Future - Comprehensive metabolic panel; Future - Lipid panel; Future - PSA; Future - TSH; Future - metFORMIN (GLUCOPHAGE) 500 MG tablet; Take 1 tablet (500 mg total) by mouth daily with breakfast.  Dispense: 90 tablet; Refill: 1  3. Essential hypertension - Controlled. No change in medication - CBC with Differential/Platelet; Future - Comprehensive metabolic panel; Future - Lipid panel; Future - PSA; Future - TSH; Future - losartan (COZAAR) 25 MG tablet; Take 1 tablet (25 mg total) by mouth daily.  Dispense: 90 tablet; Refill: 3  4. Acute idiopathic gout of right foot - Well controlled.  - CBC with Differential/Platelet; Future - Comprehensive metabolic panel; Future - Lipid panel; Future - PSA; Future - TSH; Future - allopurinol (ZYLOPRIM) 300 MG tablet; Take 1 tablet by mouth once daily  Dispense: 90 tablet; Refill: 3  5. Prostate cancer screening   6. Mixed hyperlipidemia - Consider increase in statin  - atorvastatin (LIPITOR) 10 MG tablet; Take 1 tablet (10 mg total) by mouth daily.  Dispense: 90 tablet; Refill: 3  7. Fatigue, unspecified type -Concern for sleep apnea but likely multifactorial due to lack of exercise.  Would like to hold off on sleep study at this time.  - Encouraged more exercise - Vitamin D, 25-hydroxy; Future   Dorothyann Peng, NP

## 2020-06-02 ENCOUNTER — Other Ambulatory Visit (INDEPENDENT_AMBULATORY_CARE_PROVIDER_SITE_OTHER): Payer: PRIVATE HEALTH INSURANCE

## 2020-06-02 ENCOUNTER — Other Ambulatory Visit: Payer: Self-pay

## 2020-06-02 DIAGNOSIS — Z0000 Encounter for general adult medical examination without abnormal findings: Secondary | ICD-10-CM | POA: Diagnosis not present

## 2020-06-02 DIAGNOSIS — E118 Type 2 diabetes mellitus with unspecified complications: Secondary | ICD-10-CM

## 2020-06-02 DIAGNOSIS — R5383 Other fatigue: Secondary | ICD-10-CM | POA: Diagnosis not present

## 2020-06-02 DIAGNOSIS — Z125 Encounter for screening for malignant neoplasm of prostate: Secondary | ICD-10-CM

## 2020-06-02 DIAGNOSIS — M10071 Idiopathic gout, right ankle and foot: Secondary | ICD-10-CM | POA: Diagnosis not present

## 2020-06-02 DIAGNOSIS — I10 Essential (primary) hypertension: Secondary | ICD-10-CM | POA: Diagnosis not present

## 2020-06-02 LAB — CBC WITH DIFFERENTIAL/PLATELET
Basophils Absolute: 0.1 10*3/uL (ref 0.0–0.1)
Basophils Relative: 0.7 % (ref 0.0–3.0)
Eosinophils Absolute: 0.2 10*3/uL (ref 0.0–0.7)
Eosinophils Relative: 2.5 % (ref 0.0–5.0)
HCT: 39.5 % (ref 39.0–52.0)
Hemoglobin: 13.4 g/dL (ref 13.0–17.0)
Lymphocytes Relative: 34.1 % (ref 12.0–46.0)
Lymphs Abs: 2.7 10*3/uL (ref 0.7–4.0)
MCHC: 33.9 g/dL (ref 30.0–36.0)
MCV: 87.8 fl (ref 78.0–100.0)
Monocytes Absolute: 0.7 10*3/uL (ref 0.1–1.0)
Monocytes Relative: 8.8 % (ref 3.0–12.0)
Neutro Abs: 4.3 10*3/uL (ref 1.4–7.7)
Neutrophils Relative %: 53.9 % (ref 43.0–77.0)
Platelets: 182 10*3/uL (ref 150.0–400.0)
RBC: 4.5 Mil/uL (ref 4.22–5.81)
RDW: 13.7 % (ref 11.5–15.5)
WBC: 7.9 10*3/uL (ref 4.0–10.5)

## 2020-06-02 LAB — PSA: PSA: 0.23 ng/mL (ref 0.10–4.00)

## 2020-06-02 LAB — LIPID PANEL
Cholesterol: 128 mg/dL (ref 0–200)
HDL: 39.6 mg/dL (ref >39.00)
LDL Cholesterol: 64 mg/dL (ref 0–99)
NonHDL: 88.47
Total CHOL/HDL Ratio: 3
Triglycerides: 121 mg/dL (ref 0.0–149.0)
VLDL: 24.2 mg/dL (ref 0.0–40.0)

## 2020-06-02 LAB — COMPREHENSIVE METABOLIC PANEL
ALT: 36 U/L (ref 0–53)
AST: 24 U/L (ref 0–37)
Albumin: 4.1 g/dL (ref 3.5–5.2)
Alkaline Phosphatase: 102 U/L (ref 39–117)
BUN: 18 mg/dL (ref 6–23)
CO2: 28 mEq/L (ref 19–32)
Calcium: 9.2 mg/dL (ref 8.4–10.5)
Chloride: 103 mEq/L (ref 96–112)
Creatinine, Ser: 0.87 mg/dL (ref 0.40–1.50)
GFR: 94.76 mL/min (ref >60.00)
Glucose, Bld: 97 mg/dL (ref 70–99)
Potassium: 4.2 mEq/L (ref 3.5–5.1)
Sodium: 138 mEq/L (ref 135–145)
Total Bilirubin: 0.8 mg/dL (ref 0.2–1.2)
Total Protein: 6.9 g/dL (ref 6.0–8.3)

## 2020-06-02 LAB — VITAMIN D 25 HYDROXY (VIT D DEFICIENCY, FRACTURES): VITD: 17.39 ng/mL — ABNORMAL LOW (ref 30.00–100.00)

## 2020-06-02 LAB — TSH: TSH: 2.73 u[IU]/mL (ref 0.35–4.50)

## 2020-11-04 LAB — HM DIABETES EYE EXAM

## 2020-11-24 ENCOUNTER — Other Ambulatory Visit: Payer: Self-pay

## 2020-11-25 ENCOUNTER — Ambulatory Visit (INDEPENDENT_AMBULATORY_CARE_PROVIDER_SITE_OTHER): Payer: PRIVATE HEALTH INSURANCE | Admitting: Adult Health

## 2020-11-25 ENCOUNTER — Encounter: Payer: Self-pay | Admitting: Adult Health

## 2020-11-25 VITALS — BP 126/72 | HR 67 | Temp 97.7°F | Ht 68.0 in | Wt 258.0 lb

## 2020-11-25 DIAGNOSIS — E118 Type 2 diabetes mellitus with unspecified complications: Secondary | ICD-10-CM

## 2020-11-25 DIAGNOSIS — I10 Essential (primary) hypertension: Secondary | ICD-10-CM | POA: Diagnosis not present

## 2020-11-25 LAB — POCT GLYCOSYLATED HEMOGLOBIN (HGB A1C): Hemoglobin A1C: 7.1 % — AB (ref 4.0–5.6)

## 2020-11-25 NOTE — Progress Notes (Signed)
Subjective:    Patient ID: Dylan Estrada, male    DOB: 09/25/1960, 60 y.o.   MRN: [id number]  HPI  60 year old male who  has a past medical history of DM (diabetes mellitus) ([place]), Gout, Hemorrhoids, Hyperlipidemia, and Hypertension.  He presents to the office today for 52-month follow-up regarding diabetes mellitus.  He is currently maintained on metformin 500 mg twice daily.  He does not monitor his blood sugars at home.  He does try and stay active with bowling but does not get much exercise outside of that.  He continues to indulge in sweets. His last A1c was 6.5   Lab Results  Component Value Date   HGBA1C 7.1 (A) 11/25/2020   Hypertension-is prescribed losartan 25 mg daily.  He denies dizziness, lightheadedness, blurred vision, or headaches. BP Readings from Last 3 Encounters:  11/25/20 126/72  05/26/20 130/80  11/26/19 122/70   Wt Readings from Last 3 Encounters:  11/25/20 258 lb (117 kg)  05/26/20 256 lb 9.6 oz (116.4 kg)  11/26/19 259 lb 9.6 oz (117.8 kg)   Review of Systems See HPI   Past Medical History:  Diagnosis Date   DM (diabetes mellitus) ([place])    Gout    Hemorrhoids    Hyperlipidemia    Hypertension     Social History   Socioeconomic History   Marital status: Single    Spouse name: Not on file   Number of children: Not on file   Years of education: Not on file   Highest education level: Not on file  Occupational History   Not on file  Tobacco Use   Smoking status: Never   Smokeless tobacco: Never  Substance and Sexual Activity   Alcohol use: No   Drug use: No   Sexual activity: Not on file  Other Topics Concern   Not on file  Social History Narrative   Married   Two step kids   7 grandchildren    2 great grand kids         Social Determinants of Health   Financial Resource Strain: Not on file  Food Insecurity: Not on file  Transportation Needs: Not on file  Physical Activity: Not on file  Stress: Not on file  Social  Connections: Not on file  Intimate Partner Violence: Not on file    Past Surgical History:  Procedure Laterality Date   HERNIA REPAIR     HIP ARTHRODESIS W/ ILIAC CREST BONE GRAFT     KNEE ARTHROPLASTY Left     Family History  Problem Relation Age of Onset   Diabetes Mother    Kidney failure Mother    Hyperlipidemia Father    Pancreatic cancer Father     No Known Allergies  Current Outpatient Medications on File Prior to Visit  Medication Sig Dispense Refill   tirzepatide (MOUNJARO) 2.5 MG/0.5ML Pen Inject 2.5 mg into the skin once a week.     allopurinol (ZYLOPRIM) 300 MG tablet Take 1 tablet by mouth once daily 90 tablet 3   atorvastatin (LIPITOR) 10 MG tablet Take 1 tablet (10 mg total) by mouth daily. 90 tablet 3   losartan (COZAAR) 25 MG tablet Take 1 tablet (25 mg total) by mouth daily. 90 tablet 3   metFORMIN (GLUCOPHAGE) 500 MG tablet Take 1 tablet (500 mg total) by mouth daily with breakfast. 90 tablet 1   No current facility-administered medications on file prior to visit.    BP 126/72  Pulse 67   Temp 97.7 F (36.5 C) (Oral)   Ht 5\' 8"  (1.727 m)   Wt 258 lb (117 kg)   SpO2 97%   BMI 39.23 kg/m       Objective:   Physical Exam Vitals and nursing note reviewed.  Constitutional:      Appearance: Normal appearance. He is obese.  Cardiovascular:     Rate and Rhythm: Normal rate and regular rhythm.     Pulses: Normal pulses.     Heart sounds: Normal heart sounds.  Pulmonary:     Effort: Pulmonary effort is normal.     Breath sounds: Normal breath sounds.  Skin:    General: Skin is warm and dry.  Neurological:     General: No focal deficit present.     Mental Status: He is alert and oriented to person, place, and time.  Psychiatric:        Mood and Affect: Mood normal.      Assessment & Plan:  1. Type 2 diabetes mellitus with complication, without long-term current use of insulin (HCC)  - POC HgB A1c- 7.1  - Will d/c metformin and start on  MOunjaro 2.5 mg weekly.  - Follow up in one month virtually.   2. Essential hypertension - Controlled.  - No change in medications   Dorothyann Peng, NP

## 2020-11-25 NOTE — Patient Instructions (Signed)
It was great seeing you today   I am going to try a new medication for your diabetes. Stop the metformin   Follow up with me in a month virtually to see how you are doing

## 2020-11-26 ENCOUNTER — Encounter: Payer: Self-pay | Admitting: Adult Health

## 2020-12-21 ENCOUNTER — Other Ambulatory Visit: Payer: Self-pay | Admitting: Adult Health

## 2020-12-21 MED ORDER — TIRZEPATIDE 5 MG/0.5ML ~~LOC~~ SOAJ: 5.0000 mg | SUBCUTANEOUS | 0 refills | Status: DC

## 2020-12-21 NOTE — Telephone Encounter (Signed)
Per pt "Dr [name] i will need the next level before my dr appt on 11-8 do i request at pharmacy"

## 2020-12-21 NOTE — Telephone Encounter (Signed)
Message routed to PCP.

## 2020-12-27 ENCOUNTER — Other Ambulatory Visit: Payer: Self-pay

## 2020-12-28 ENCOUNTER — Ambulatory Visit (INDEPENDENT_AMBULATORY_CARE_PROVIDER_SITE_OTHER): Payer: PRIVATE HEALTH INSURANCE | Admitting: Adult Health

## 2020-12-28 ENCOUNTER — Encounter: Payer: Self-pay | Admitting: Adult Health

## 2020-12-28 VITALS — BP 130/82 | HR 65 | Temp 98.6°F | Ht 68.0 in | Wt 250.0 lb

## 2020-12-28 DIAGNOSIS — E118 Type 2 diabetes mellitus with unspecified complications: Secondary | ICD-10-CM | POA: Diagnosis not present

## 2020-12-28 NOTE — Progress Notes (Signed)
Subjective:    Patient ID: HELEN CUFF, male    DOB: Dec 30, 1960, 60 y.o.   MRN: [id number]  HPI  60 year old male who  has a past medical history of DM (diabetes mellitus) ([place]), Gout, Hemorrhoids, Hyperlipidemia, and Hypertension.  He presents to the office today for 1 month follow-up regarding diabetes melitis type II.  During his last visit in October 2022 his regimen was changed from taking metformin 500 mg twice daily to Mounjaro 2.5 mg weekly.  Since starting on Mounjaro he has had looser stools ( not diarrhea). Denies feeling of low blood sugar, GERD, abdominal pain.   He feels as though his appetite is more controlled and he is eating less.   Wt Readings from Last 3 Encounters:  12/28/20 250 lb (113.4 kg)  11/25/20 258 lb (117 kg)  05/26/20 256 lb 9.6 oz (116.4 kg)   Review of Systems See HPI   Past Medical History:  Diagnosis Date   DM (diabetes mellitus) ([place])    Gout    Hemorrhoids    Hyperlipidemia    Hypertension     Social History   Socioeconomic History   Marital status: Single    Spouse name: Not on file   Number of children: Not on file   Years of education: Not on file   Highest education level: Not on file  Occupational History   Not on file  Tobacco Use   Smoking status: Never   Smokeless tobacco: Never  Substance and Sexual Activity   Alcohol use: No   Drug use: No   Sexual activity: Not on file  Other Topics Concern   Not on file  Social History Narrative   Married   Two step kids   7 grandchildren    2 great grand kids         Social Determinants of Health   Financial Resource Strain: Not on file  Food Insecurity: Not on file  Transportation Needs: Not on file  Physical Activity: Not on file  Stress: Not on file  Social Connections: Not on file  Intimate Partner Violence: Not on file    Past Surgical History:  Procedure Laterality Date   HERNIA REPAIR     HIP ARTHRODESIS W/ ILIAC CREST BONE GRAFT     KNEE ARTHROPLASTY  Left     Family History  Problem Relation Age of Onset   Diabetes Mother    Kidney failure Mother    Hyperlipidemia Father    Pancreatic cancer Father     No Known Allergies  Current Outpatient Medications on File Prior to Visit  Medication Sig Dispense Refill   allopurinol (ZYLOPRIM) 300 MG tablet Take 1 tablet by mouth once daily 90 tablet 3   atorvastatin (LIPITOR) 10 MG tablet Take 1 tablet (10 mg total) by mouth daily. 90 tablet 3   losartan (COZAAR) 25 MG tablet Take 1 tablet (25 mg total) by mouth daily. 90 tablet 3   tirzepatide (MOUNJARO) 5 MG/0.5ML Pen Inject 5 mg into the skin once a week. 6 mL 0   No current facility-administered medications on file prior to visit.    BP 130/82   Pulse 65   Temp 98.6 F (37 C) (Oral)   Ht 5\' 8"  (1.727 m)   Wt 250 lb (113.4 kg)   SpO2 98%   BMI 38.01 kg/m       Objective:   Physical Exam Vitals and nursing note reviewed.  Constitutional:  Appearance: Normal appearance.  Cardiovascular:     Rate and Rhythm: Normal rate and regular rhythm.     Pulses: Normal pulses.     Heart sounds: Normal heart sounds.  Pulmonary:     Effort: Pulmonary effort is normal.     Breath sounds: Normal breath sounds.  Musculoskeletal:        General: Normal range of motion.  Skin:    General: Skin is warm and dry.  Neurological:     General: No focal deficit present.     Mental Status: He is alert and oriented to person, place, and time.  Psychiatric:        Mood and Affect: Mood normal.        Behavior: Behavior normal.        Thought Content: Thought content normal.        Judgment: Judgment normal.       Assessment & Plan:  1. Type 2 diabetes mellitus with complication, without long-term current use of insulin (HCC) - Has lost 8 pounds in the last month  - Continue with 5 mg dose  - Follow up in one month for increase in dose   Dorothyann Peng, NP

## 2021-01-19 ENCOUNTER — Other Ambulatory Visit: Payer: Self-pay | Admitting: Adult Health

## 2021-01-25 NOTE — Telephone Encounter (Signed)
Pt has appt scheduled for 1207

## 2021-01-26 ENCOUNTER — Encounter: Payer: Self-pay | Admitting: Adult Health

## 2021-01-26 ENCOUNTER — Telehealth (INDEPENDENT_AMBULATORY_CARE_PROVIDER_SITE_OTHER): Payer: PRIVATE HEALTH INSURANCE | Admitting: Adult Health

## 2021-01-26 VITALS — Ht 68.0 in | Wt 242.1 lb

## 2021-01-26 DIAGNOSIS — E118 Type 2 diabetes mellitus with unspecified complications: Secondary | ICD-10-CM

## 2021-01-26 MED ORDER — TIRZEPATIDE 7.5 MG/0.5ML ~~LOC~~ SOAJ: 7.5000 mg | SUBCUTANEOUS | 0 refills | Status: AC

## 2021-01-26 NOTE — Progress Notes (Signed)
Virtual Visit via Video Note  I connected with Dylan Estrada on 01/26/21 at  2:00 PM EST by a video enabled telemedicine application and verified that I am speaking with the correct person using two identifiers.  Location patient: home Location provider:work or home office Persons participating in the virtual visit: patient, provider  I discussed the limitations of evaluation and management by telemedicine and the availability of in person appointments. The patient expressed understanding and agreed to proceed.   HPI: He is being evaluated today for 1 month follow-up regarding diabetes mellitus type 2.  He is currently maintained on Mounjaro 5 mg weekly.  He has been able to transition fairly well, has had some loose stools. He has been able to decrease his portion size on his meals. His weight is down 16 pounds since he started the medication  Lab Results  Component Value Date   HGBA1C 7.1 (A) 11/25/2020   Wt Readings from Last 5 Encounters:  01/26/21 242 lb 1.6 oz (109.8 kg)  12/28/20 250 lb (113.4 kg)  11/25/20 258 lb (117 kg)  05/26/20 256 lb 9.6 oz (116.4 kg)  11/26/19 259 lb 9.6 oz (117.8 kg)    ROS: See pertinent positives and negatives per HPI.  Past Medical History:  Diagnosis Date   DM (diabetes mellitus) ([place])    Gout    Hemorrhoids    Hyperlipidemia    Hypertension     Past Surgical History:  Procedure Laterality Date   HERNIA REPAIR     HIP ARTHRODESIS W/ ILIAC CREST BONE GRAFT     KNEE ARTHROPLASTY Left     Family History  Problem Relation Age of Onset   Diabetes Mother    Kidney failure Mother    Hyperlipidemia Father    Pancreatic cancer Father        Current Outpatient Medications:    allopurinol (ZYLOPRIM) 300 MG tablet, Take 1 tablet by mouth once daily, Disp: 90 tablet, Rfl: 3   atorvastatin (LIPITOR) 10 MG tablet, Take 1 tablet (10 mg total) by mouth daily., Disp: 90 tablet, Rfl: 3   losartan (COZAAR) 25 MG tablet, Take 1 tablet (25 mg  total) by mouth daily., Disp: 90 tablet, Rfl: 3   tirzepatide (MOUNJARO) 5 MG/0.5ML Pen, Inject 5 mg into the skin once a week., Disp: 6 mL, Rfl: 0  EXAM:  VITALS per patient if applicable:  GENERAL: alert, oriented, appears well and in no acute distress  HEENT: atraumatic, conjunttiva clear, no obvious abnormalities on inspection of external nose and ears  NECK: normal movements of the head and neck  LUNGS: on inspection no signs of respiratory distress, breathing rate appears normal, no obvious gross SOB, gasping or wheezing  CV: no obvious cyanosis  MS: moves all visible extremities without noticeable abnormality  PSYCH/NEURO: pleasant and cooperative, no obvious depression or anxiety, speech and thought processing grossly intact  ASSESSMENT AND PLAN:  Discussed the following assessment and plan:  1. Type 2 diabetes mellitus with complication, without long-term current use of insulin (HCC)  - tirzepatide (MOUNJARO) 7.5 MG/0.5ML Pen; Inject 7.5 mg into the skin once a week.  Dispense: 2 mL; Refill: 0 - Will have him follow up in 1 month and we will recheck his A1c then    I discussed the assessment and treatment plan with the patient. The patient was provided an opportunity to ask questions and all were answered. The patient agreed with the plan and demonstrated an understanding of the instructions.   The  patient was advised to call back or seek an in-person evaluation if the symptoms worsen or if the condition fails to improve as anticipated.   Dorothyann Peng, NP

## 2021-02-02 ENCOUNTER — Other Ambulatory Visit: Payer: Self-pay | Admitting: Adult Health

## 2021-02-02 DIAGNOSIS — E118 Type 2 diabetes mellitus with unspecified complications: Secondary | ICD-10-CM

## 2021-02-10 ENCOUNTER — Other Ambulatory Visit: Payer: Self-pay | Admitting: Adult Health

## 2021-02-10 DIAGNOSIS — E118 Type 2 diabetes mellitus with unspecified complications: Secondary | ICD-10-CM

## 2021-02-16 NOTE — Telephone Encounter (Signed)
I have called pt back and informed him to check with the pharmacy every so often because the higher doses of the medication could be on back order. I confirmed that pt was doing his part by monitoring his diet and exercising more. He stated that he is going to wait it out until the end of this year and if he is still not able to get the [facility] then he will call us back.   FYI to provider.

## 2021-03-03 ENCOUNTER — Encounter: Payer: Self-pay | Admitting: Adult Health

## 2021-03-03 NOTE — Telephone Encounter (Signed)
Pt PA was pending. Called pt insurance plan and they stated that pt plan does not cover injectables and does not do [place]. Per representative pt is responsible for 100% of cost for injectables. Pt notified of update. Will send to [facility] for advise. Pt verbalized understanding and will wait for [facility].

## 2021-03-04 ENCOUNTER — Telehealth: Payer: Self-pay | Admitting: Adult Health

## 2021-03-04 NOTE — Telephone Encounter (Signed)
Entered in error

## 2021-03-09 ENCOUNTER — Other Ambulatory Visit: Payer: Self-pay | Admitting: Adult Health

## 2021-03-09 DIAGNOSIS — E118 Type 2 diabetes mellitus with unspecified complications: Secondary | ICD-10-CM

## 2021-03-17 ENCOUNTER — Other Ambulatory Visit: Payer: Self-pay | Admitting: Adult Health

## 2021-03-17 MED ORDER — TIRZEPATIDE 7.5 MG/0.5ML ~~LOC~~ SOAJ: 7.5000 mg | SUBCUTANEOUS | 0 refills | Status: DC

## 2021-03-18 ENCOUNTER — Other Ambulatory Visit: Payer: Self-pay | Admitting: Adult Health

## 2021-03-18 MED ORDER — METFORMIN HCL 500 MG PO TABS: 500.0000 mg | ORAL_TABLET | Freq: Every day | ORAL | 0 refills | Status: DC

## 2021-05-08 ENCOUNTER — Other Ambulatory Visit: Payer: Self-pay | Admitting: Adult Health

## 2021-05-08 DIAGNOSIS — E782 Mixed hyperlipidemia: Secondary | ICD-10-CM

## 2021-05-09 NOTE — Telephone Encounter (Signed)
Patient need to schedule an ov for more refills. 

## 2021-06-08 ENCOUNTER — Other Ambulatory Visit: Payer: Self-pay | Admitting: Adult Health

## 2021-06-08 DIAGNOSIS — M10071 Idiopathic gout, right ankle and foot: Secondary | ICD-10-CM

## 2021-06-13 ENCOUNTER — Other Ambulatory Visit: Payer: Self-pay | Admitting: Adult Health

## 2021-06-14 ENCOUNTER — Other Ambulatory Visit: Payer: Self-pay | Admitting: Adult Health

## 2021-06-14 DIAGNOSIS — E782 Mixed hyperlipidemia: Secondary | ICD-10-CM

## 2021-06-23 ENCOUNTER — Ambulatory Visit (INDEPENDENT_AMBULATORY_CARE_PROVIDER_SITE_OTHER): Payer: PRIVATE HEALTH INSURANCE | Admitting: Adult Health

## 2021-06-23 ENCOUNTER — Encounter: Payer: Self-pay | Admitting: Adult Health

## 2021-06-23 VITALS — BP 128/80 | HR 72 | Temp 99.1°F | Ht 68.0 in | Wt 248.0 lb

## 2021-06-23 DIAGNOSIS — Z0000 Encounter for general adult medical examination without abnormal findings: Secondary | ICD-10-CM | POA: Diagnosis not present

## 2021-06-23 DIAGNOSIS — E118 Type 2 diabetes mellitus with unspecified complications: Secondary | ICD-10-CM

## 2021-06-23 DIAGNOSIS — Z125 Encounter for screening for malignant neoplasm of prostate: Secondary | ICD-10-CM

## 2021-06-23 DIAGNOSIS — Z114 Encounter for screening for human immunodeficiency virus [HIV]: Secondary | ICD-10-CM

## 2021-06-23 DIAGNOSIS — I10 Essential (primary) hypertension: Secondary | ICD-10-CM | POA: Diagnosis not present

## 2021-06-23 DIAGNOSIS — Z1159 Encounter for screening for other viral diseases: Secondary | ICD-10-CM

## 2021-06-23 DIAGNOSIS — E782 Mixed hyperlipidemia: Secondary | ICD-10-CM

## 2021-06-23 DIAGNOSIS — M10071 Idiopathic gout, right ankle and foot: Secondary | ICD-10-CM | POA: Diagnosis not present

## 2021-06-23 MED ORDER — LOSARTAN POTASSIUM 25 MG PO TABS: 25.0000 mg | ORAL_TABLET | Freq: Every day | ORAL | 3 refills | Status: DC

## 2021-06-23 MED ORDER — ALLOPURINOL 300 MG PO TABS: 300.0000 mg | ORAL_TABLET | Freq: Every day | ORAL | 3 refills | Status: DC

## 2021-06-23 MED ORDER — ATORVASTATIN CALCIUM 10 MG PO TABS: 10.0000 mg | ORAL_TABLET | Freq: Every day | ORAL | 3 refills | Status: DC

## 2021-06-23 NOTE — Progress Notes (Signed)
? ?Subjective:  ? ? Patient ID: Dylan Estrada, male    DOB: 19-Feb-1961, 61 y.o.   MRN: [id number] ? ?HPI ?Patient presents for yearly preventative medicine examination. He is a pleasant 61 year old male who  has a past medical history of DM (diabetes mellitus) ([place]), Gout, Hemorrhoids, Hyperlipidemia, and Hypertension. ? ?DM Type 2 -controlled with metformin 500 mg daily.  In the past we have tried getting him approved for [facility] on Trulicity, his insurance company does not cover any injectables. ? ?Lab Results  ?Component Value Date  ? HGBA1C 7.1 (A) 11/25/2020  ? ?HTN -prescribed losartan 25 mg daily.  He denies dizziness, lightheadedness, blurred vision, or headaches ?BP Readings from Last 3 Encounters:  ?06/23/21 128/80  ?12/28/20 130/82  ?11/25/20 126/72  ? ?Hyperlipidemia-takes Lipitor 10 mg daily.  He denies myalgia or fatigue ? ?Lab Results  ?Component Value Date  ? CHOL 128 06/02/2020  ? HDL 39.60 06/02/2020  ? [place] 64 06/02/2020  ? LDLDIRECT 129.2 12/18/2013  ? TRIG 121.0 06/02/2020  ? CHOLHDL 3 06/02/2020  ? ?Gout -takes allopurinol 300 mg daily.  He denies any recent gout flares ? ?All immunizations and health maintenance protocols were reviewed with the patient and needed orders were placed. ? ?Appropriate screening laboratory values were ordered for the patient including screening of hyperlipidemia, renal function and hepatic function. ?If indicated by BPH, a PSA was ordered. ? ?Medication reconciliation,  past medical history, social history, problem list and allergies were reviewed in detail with the patient ? ?Goals were established with regard to weight loss, exercise, and  diet in compliance with medications ?Wt Readings from Last 3 Encounters:  ?06/23/21 248 lb (112.5 kg)  ?01/26/21 242 lb 1.6 oz (109.8 kg)  ?12/28/20 250 lb (113.4 kg)  ? ?He does report being up-to-date on colon cancer screening.  Has his colonoscopies done through [facility]. ? ?Review of Systems  ?Constitutional: Negative.    ?HENT: Negative.    ?Eyes: Negative.   ?Respiratory: Negative.    ?Cardiovascular: Negative.   ?Gastrointestinal: Negative.   ?Endocrine: Negative.   ?Genitourinary: Negative.   ?Musculoskeletal: Negative.   ?Skin: Negative.   ?Allergic/Immunologic: Negative.   ?Neurological: Negative.   ?Hematological: Negative.   ?Psychiatric/Behavioral: Negative.    ?All other systems reviewed and are negative. ? ? ?Past Medical History:  ?Diagnosis Date  ? DM (diabetes mellitus) ([place])   ? Gout   ? Hemorrhoids   ? Hyperlipidemia   ? Hypertension   ? ? ?Social History  ? ?Socioeconomic History  ? Marital status: Single  ?  Spouse name: Not on file  ? Number of children: Not on file  ? Years of education: Not on file  ? Highest education level: Not on file  ?Occupational History  ? Not on file  ?Tobacco Use  ? Smoking status: Never  ? Smokeless tobacco: Never  ?Substance and Sexual Activity  ? Alcohol use: No  ? Drug use: No  ? Sexual activity: Not on file  ?Other Topics Concern  ? Not on file  ?Social History Narrative  ? Married  ? Two step kids  ? 7 grandchildren   ? 2 great grand kids  ?   ?   ? ?Social Determinants of Health  ? ?Financial Resource Strain: Not on file  ?Food Insecurity: Not on file  ?Transportation Needs: Not on file  ?Physical Activity: Not on file  ?Stress: Not on file  ?Social Connections: Not on file  ?Intimate Partner  Violence: Not on file  ? ? ?Past Surgical History:  ?Procedure Laterality Date  ? HERNIA REPAIR    ? HIP ARTHRODESIS W/ ILIAC CREST BONE GRAFT    ? KNEE ARTHROPLASTY Left   ? ? ?Family History  ?Problem Relation Age of Onset  ? Diabetes Mother   ? Kidney failure Mother   ? Hyperlipidemia Father   ? Pancreatic cancer Father   ? ? ?No Known Allergies ? ?Current Outpatient Medications on File Prior to Visit  ?Medication Sig Dispense Refill  ? metFORMIN (GLUCOPHAGE) 500 MG tablet Take 1 tablet by mouth once daily with breakfast 90 tablet 0  ? ?No current facility-administered medications on file  prior to visit.  ? ? ?BP 128/80   Pulse 72   Temp 99.1 ?F (37.3 ?C) (Oral)   Ht '5\' 8"'$  (1.727 m)   Wt 248 lb (112.5 kg)   SpO2 97%   BMI 37.71 kg/m?  ? ? ?   ?Objective:  ? Physical Exam ?Vitals and nursing note reviewed.  ?Constitutional:   ?   General: He is not in acute distress. ?   Appearance: Normal appearance. He is well-developed. He is obese.  ?HENT:  ?   Head: Normocephalic and atraumatic.  ?   Right Ear: Tympanic membrane, ear canal and external ear normal. There is no impacted cerumen.  ?   Left Ear: Tympanic membrane, ear canal and external ear normal. There is no impacted cerumen.  ?   Nose: Nose normal. No congestion or rhinorrhea.  ?   Mouth/Throat:  ?   Mouth: Mucous membranes are moist.  ?   Pharynx: Oropharynx is clear. No oropharyngeal exudate or posterior oropharyngeal erythema.  ?Eyes:  ?   General:     ?   Right eye: No discharge.     ?   Left eye: No discharge.  ?   Extraocular Movements: Extraocular movements intact.  ?   Conjunctiva/sclera: Conjunctivae normal.  ?   Pupils: Pupils are equal, round, and reactive to light.  ?Neck:  ?   Vascular: No carotid bruit.  ?   Trachea: No tracheal deviation.  ?Cardiovascular:  ?   Rate and Rhythm: Normal rate and regular rhythm.  ?   Pulses: Normal pulses.  ?   Heart sounds: Normal heart sounds. No murmur heard. ?  No friction rub. No gallop.  ?Pulmonary:  ?   Effort: Pulmonary effort is normal. No respiratory distress.  ?   Breath sounds: Normal breath sounds. No stridor. No wheezing, rhonchi or rales.  ?Chest:  ?   Chest wall: No tenderness.  ?Abdominal:  ?   General: Bowel sounds are normal. There is no distension.  ?   Palpations: Abdomen is soft. There is no mass.  ?   Tenderness: There is no abdominal tenderness. There is no right CVA tenderness, left CVA tenderness, guarding or rebound.  ?   Hernia: No hernia is present.  ?Musculoskeletal:     ?   General: No swelling, tenderness, deformity or signs of injury. Normal range of motion.  ?    Right lower leg: No edema.  ?   Left lower leg: No edema.  ?Lymphadenopathy:  ?   Cervical: No cervical adenopathy.  ?Skin: ?   General: Skin is warm and dry.  ?   Capillary Refill: Capillary refill takes less than 2 seconds.  ?   Coloration: Skin is not jaundiced or pale.  ?   Findings: No bruising, erythema, lesion  or rash.  ?Neurological:  ?   General: No focal deficit present.  ?   Mental Status: He is alert and oriented to person, place, and time.  ?   Cranial Nerves: No cranial nerve deficit.  ?   Sensory: No sensory deficit.  ?   Motor: No weakness.  ?   Coordination: Coordination normal.  ?   Gait: Gait normal.  ?   Deep Tendon Reflexes: Reflexes normal.  ?Psychiatric:     ?   Mood and Affect: Mood normal.     ?   Behavior: Behavior normal.     ?   Thought Content: Thought content normal.     ?   Judgment: Judgment normal.  ? ?   ?Assessment & Plan:  ?1. Routine general medical examination at a health care facility ?- Encouraged weight loss  ?- Refused shingles vaccination at this time  ?- Follow up in one year or sooner if needed ?- CBC with Differential/Platelet; Future ?- Comprehensive metabolic panel; Future ?- Hemoglobin A1c; Future ?- Lipid panel; Future ?- TSH; Future ?- TSH ?- Lipid panel ?- Hemoglobin A1c ?- Comprehensive metabolic panel ?- CBC with Differential/Platelet ? ?2. Essential hypertension ?- Well controlled. No change in medication  ?- CBC with Differential/Platelet; Future ?- Comprehensive metabolic panel; Future ?- Hemoglobin A1c; Future ?- Lipid panel; Future ?- TSH; Future ?- losartan (COZAAR) 25 MG tablet; Take 1 tablet (25 mg total) by mouth daily.  Dispense: 90 tablet; Refill: 3 ? ?3. Type 2 diabetes mellitus with complication, without long-term current use of insulin ([place]) ?- Consider increase in Metformin  ?- Likely follow up in 6 months  ?- CBC with Differential/Platelet; Future ?- Comprehensive metabolic panel; Future ?- Hemoglobin A1c; Future ?- Lipid panel; Future ?- TSH;  Future ? ?4. Acute idiopathic gout of right foot ? ?- allopurinol (ZYLOPRIM) 300 MG tablet; Take 1 tablet (300 mg total) by mouth daily.  Dispense: 90 tablet; Refill: 3 ? ?5. Prostate cancer screening ? ?- PS

## 2021-06-23 NOTE — Patient Instructions (Signed)
It was great seeing you today   We will follow up with you regarding your lab work   Please let me know if you need anything   

## 2021-06-24 LAB — COMPREHENSIVE METABOLIC PANEL
ALT: 20 U/L (ref 0–53)
AST: 16 U/L (ref 0–37)
Albumin: 4.3 g/dL (ref 3.5–5.2)
Alkaline Phosphatase: 90 U/L (ref 39–117)
BUN: 15 mg/dL (ref 6–23)
CO2: 30 mEq/L (ref 19–32)
Calcium: 9.1 mg/dL (ref 8.4–10.5)
Chloride: 104 mEq/L (ref 96–112)
Creatinine, Ser: 0.84 mg/dL (ref 0.40–1.50)
GFR: 95.06 mL/min (ref >60.00)
Glucose, Bld: 98 mg/dL (ref 70–99)
Potassium: 4.3 mEq/L (ref 3.5–5.1)
Sodium: 142 mEq/L (ref 135–145)
Total Bilirubin: 0.8 mg/dL (ref 0.2–1.2)
Total Protein: 6.9 g/dL (ref 6.0–8.3)

## 2021-06-24 LAB — CBC WITH DIFFERENTIAL/PLATELET
Basophils Absolute: 0.1 10*3/uL (ref 0.0–0.1)
Basophils Relative: 0.6 % (ref 0.0–3.0)
Eosinophils Absolute: 0.3 10*3/uL (ref 0.0–0.7)
Eosinophils Relative: 4 % (ref 0.0–5.0)
HCT: 39.5 % (ref 39.0–52.0)
Hemoglobin: 13.3 g/dL (ref 13.0–17.0)
Lymphocytes Relative: 35.7 % (ref 12.0–46.0)
Lymphs Abs: 2.8 10*3/uL (ref 0.7–4.0)
MCHC: 33.8 g/dL (ref 30.0–36.0)
MCV: 89.4 fl (ref 78.0–100.0)
Monocytes Absolute: 0.6 10*3/uL (ref 0.1–1.0)
Monocytes Relative: 8.1 % (ref 3.0–12.0)
Neutro Abs: 4.1 10*3/uL (ref 1.4–7.7)
Neutrophils Relative %: 51.6 % (ref 43.0–77.0)
Platelets: 178 10*3/uL (ref 150.0–400.0)
RBC: 4.42 Mil/uL (ref 4.22–5.81)
RDW: 13.6 % (ref 11.5–15.5)
WBC: 7.9 10*3/uL (ref 4.0–10.5)

## 2021-06-24 LAB — LIPID PANEL
Cholesterol: 145 mg/dL (ref 0–200)
HDL: 39.7 mg/dL (ref >39.00)
LDL Cholesterol: 85 mg/dL (ref 0–99)
NonHDL: 105.17
Total CHOL/HDL Ratio: 4
Triglycerides: 101 mg/dL (ref 0.0–149.0)
VLDL: 20.2 mg/dL (ref 0.0–40.0)

## 2021-06-24 LAB — HIV ANTIBODY (ROUTINE TESTING W REFLEX): HIV 1&2 Ab, 4th Generation: NONREACTIVE

## 2021-06-24 LAB — HEMOGLOBIN A1C: Hgb A1c MFr Bld: 7.3 % — ABNORMAL HIGH (ref 4.6–6.5)

## 2021-06-24 LAB — PSA: PSA: 0.15 ng/mL (ref 0.10–4.00)

## 2021-06-24 LAB — TSH: TSH: 2.49 u[IU]/mL (ref 0.35–5.50)

## 2021-06-24 LAB — HEPATITIS C ANTIBODY
Hepatitis C Ab: NONREACTIVE
SIGNAL TO CUT-OFF: 0.2 (ref <1.00)

## 2021-09-04 ENCOUNTER — Other Ambulatory Visit: Payer: Self-pay | Admitting: Adult Health

## 2021-09-09 ENCOUNTER — Other Ambulatory Visit: Payer: Self-pay | Admitting: Adult Health

## 2021-11-30 ENCOUNTER — Other Ambulatory Visit: Payer: Self-pay | Admitting: Adult Health

## 2021-12-01 NOTE — Telephone Encounter (Signed)
Patient need to schedule an ov for more refills. 

## 2021-12-06 ENCOUNTER — Other Ambulatory Visit: Payer: Self-pay | Admitting: Adult Health

## 2021-12-08 ENCOUNTER — Other Ambulatory Visit: Payer: Self-pay | Admitting: Adult Health

## 2021-12-09 ENCOUNTER — Other Ambulatory Visit: Payer: Self-pay

## 2021-12-09 MED ORDER — METFORMIN HCL 500 MG PO TABS: 500.0000 mg | ORAL_TABLET | Freq: Every day | ORAL | 0 refills | Status: DC

## 2021-12-09 NOTE — Telephone Encounter (Signed)
Called pt back and scheduled a visit.

## 2021-12-09 NOTE — Telephone Encounter (Signed)
Left message to return phone call to schedule appt.

## 2021-12-09 NOTE — Telephone Encounter (Signed)
Patient need to schedule an ov for more refills. 

## 2021-12-09 NOTE — Telephone Encounter (Signed)
Pt called, returning CMA's call. CMA was unavailable. Pt asked that CMA call back at her earliest convenience. 

## 2021-12-22 ENCOUNTER — Ambulatory Visit (INDEPENDENT_AMBULATORY_CARE_PROVIDER_SITE_OTHER): Payer: PRIVATE HEALTH INSURANCE | Admitting: Adult Health

## 2021-12-22 ENCOUNTER — Encounter: Payer: Self-pay | Admitting: Adult Health

## 2021-12-22 VITALS — BP 160/86 | HR 61 | Temp 97.8°F | Ht 68.0 in | Wt 252.0 lb

## 2021-12-22 DIAGNOSIS — E118 Type 2 diabetes mellitus with unspecified complications: Secondary | ICD-10-CM

## 2021-12-22 DIAGNOSIS — I10 Essential (primary) hypertension: Secondary | ICD-10-CM | POA: Diagnosis not present

## 2021-12-22 LAB — POCT GLYCOSYLATED HEMOGLOBIN (HGB A1C): Hemoglobin A1C: 7.5 % — AB (ref 4.0–5.6)

## 2021-12-22 MED ORDER — METFORMIN HCL 500 MG PO TABS: 500.0000 mg | ORAL_TABLET | Freq: Two times a day (BID) | ORAL | 0 refills | Status: DC

## 2021-12-22 NOTE — Progress Notes (Signed)
Subjective:    Patient ID: Dylan Estrada, male    DOB: December 06, 1960, 61 y.o.   MRN: [id number]  HPI 61 year old male who  has a past medical history of DM (diabetes mellitus) ([place]), Gout, Hemorrhoids, Hyperlipidemia, and Hypertension.  He presents to the office today for follow-up regarding diabetes and hypertension.  Diabetes mellitus type 2-currently managed with metformin 500 mg daily.  In the past we try to get him approved for [facility] and Trulicity but his [employer] does not cover any injectables whatsoever.  Lab Results  Component Value Date   HGBA1C 7.3 (H) 06/23/2021   Wt Readings from Last 3 Encounters:  12/22/21 252 lb (114.3 kg)  06/23/21 248 lb (112.5 kg)  01/26/21 242 lb 1.6 oz (109.8 kg)   HTN -Prescribed losartan 25 mg daily.  He denies dizziness, lightheadedness, blurred vision, or headaches  BP Readings from Last 3 Encounters:  12/22/21 (!) 160/86  06/23/21 128/80  12/28/20 130/82    Review of Systems See HPI   Past Medical History:  Diagnosis Date   DM (diabetes mellitus) ([place])    Gout    Hemorrhoids    Hyperlipidemia    Hypertension     Social History   Socioeconomic History   Marital status: Single    Spouse name: Not on file   Number of children: Not on file   Years of education: Not on file   Highest education level: Not on file  Occupational History   Not on file  Tobacco Use   Smoking status: Never   Smokeless tobacco: Never  Substance and Sexual Activity   Alcohol use: No   Drug use: No   Sexual activity: Not on file  Other Topics Concern   Not on file  Social History Narrative   Married   Two step kids   7 grandchildren    2 great grand kids         Social Determinants of Health   Financial Resource Strain: Not on file  Food Insecurity: Not on file  Transportation Needs: Not on file  Physical Activity: Not on file  Stress: Not on file  Social Connections: Not on file  Intimate Partner Violence: Not on file     Past Surgical History:  Procedure Laterality Date   HERNIA REPAIR     HIP ARTHRODESIS W/ ILIAC CREST BONE GRAFT     KNEE ARTHROPLASTY Left     Family History  Problem Relation Age of Onset   Diabetes Mother    Kidney failure Mother    Hyperlipidemia Father    Pancreatic cancer Father     No Known Allergies  Current Outpatient Medications on File Prior to Visit  Medication Sig Dispense Refill   allopurinol (ZYLOPRIM) 300 MG tablet Take 1 tablet (300 mg total) by mouth daily. 90 tablet 3   atorvastatin (LIPITOR) 10 MG tablet Take 1 tablet (10 mg total) by mouth daily. 90 tablet 3   losartan (COZAAR) 25 MG tablet Take 1 tablet (25 mg total) by mouth daily. 90 tablet 3   metFORMIN (GLUCOPHAGE) 500 MG tablet Take 1 tablet (500 mg total) by mouth daily with breakfast. 90 tablet 0   No current facility-administered medications on file prior to visit.    BP (!) 160/86   Pulse 61   Temp 97.8 F (36.6 C) (Oral)   Ht '5\' 8"'$  (1.727 m)   Wt 252 lb (114.3 kg)   SpO2 100%   BMI 38.32  kg/m       Objective:   Physical Exam Vitals and nursing note reviewed.  Constitutional:      Appearance: Normal appearance.  Cardiovascular:     Rate and Rhythm: Normal rate and regular rhythm.     Pulses: Normal pulses.     Heart sounds: Normal heart sounds.  Pulmonary:     Effort: Pulmonary effort is normal.     Breath sounds: Normal breath sounds.  Musculoskeletal:        General: Normal range of motion.  Skin:    General: Skin is warm and dry.     Capillary Refill: Capillary refill takes less than 2 seconds.  Neurological:     General: No focal deficit present.     Mental Status: He is alert and oriented to person, place, and time.  Psychiatric:        Mood and Affect: Mood normal.        Behavior: Behavior normal.        Thought Content: Thought content normal.       Assessment & Plan:  1. Essential hypertension - BP not at goal today  - Will have her check it at home over  the week and send me the results  - Consider increase in Losartan  2. Type 2 diabetes mellitus with complication, without long-term current use of insulin (HCC)  - POC HgB A1c- 7.5 - has increased. Will increase Metformin to 500 mg BID  - Follow up in three months  - metFORMIN (GLUCOPHAGE) 500 MG tablet; Take 1 tablet (500 mg total) by mouth 2 (two) times daily with a meal.  Dispense: 180 tablet; Refill: 0  Dorothyann Peng, NP

## 2021-12-22 NOTE — Patient Instructions (Signed)
Your A1c increased to 7.5.   I am going to increased you metformin to 500 mg twice daily   Your blood pressure was elevated, please check at home and send me results via mychart next week

## 2021-12-30 ENCOUNTER — Other Ambulatory Visit: Payer: Self-pay | Admitting: Adult Health

## 2021-12-30 ENCOUNTER — Encounter: Payer: Self-pay | Admitting: Adult Health

## 2021-12-30 DIAGNOSIS — I10 Essential (primary) hypertension: Secondary | ICD-10-CM

## 2021-12-30 MED ORDER — LOSARTAN POTASSIUM 50 MG PO TABS: 50.0000 mg | ORAL_TABLET | Freq: Every day | ORAL | 0 refills | Status: DC

## 2021-12-30 NOTE — Telephone Encounter (Signed)
FYI

## 2022-01-20 ENCOUNTER — Other Ambulatory Visit: Payer: Self-pay | Admitting: Adult Health

## 2022-02-23 ENCOUNTER — Other Ambulatory Visit: Payer: Self-pay | Admitting: Adult Health

## 2022-03-23 ENCOUNTER — Ambulatory Visit (INDEPENDENT_AMBULATORY_CARE_PROVIDER_SITE_OTHER): Payer: PRIVATE HEALTH INSURANCE | Admitting: Adult Health

## 2022-03-23 ENCOUNTER — Encounter: Payer: Self-pay | Admitting: Adult Health

## 2022-03-23 ENCOUNTER — Other Ambulatory Visit: Payer: Self-pay | Admitting: Adult Health

## 2022-03-23 VITALS — BP 140/90 | HR 67 | Temp 97.6°F | Ht 68.0 in | Wt 247.0 lb

## 2022-03-23 DIAGNOSIS — I10 Essential (primary) hypertension: Secondary | ICD-10-CM

## 2022-03-23 DIAGNOSIS — E118 Type 2 diabetes mellitus with unspecified complications: Secondary | ICD-10-CM | POA: Diagnosis not present

## 2022-03-23 LAB — POCT GLYCOSYLATED HEMOGLOBIN (HGB A1C): Hemoglobin A1C: 7 % — AB (ref 4.0–5.6)

## 2022-03-23 MED ORDER — LOSARTAN POTASSIUM 100 MG PO TABS: 100.0000 mg | ORAL_TABLET | Freq: Every day | ORAL | 3 refills | Status: DC

## 2022-03-23 NOTE — Progress Notes (Signed)
Subjective:    Patient ID: Dylan Estrada, male    DOB: 06-Jan-1961, 62 y.o.   MRN: [id number]  HPI 62 year old male who  has a past medical history of DM (diabetes mellitus) ([place]), Gout, Hemorrhoids, Hyperlipidemia, and Hypertension.  He presents to the office today for follow-up regarding diabetes and hypertension.  Diabetes mellitus type 2-currently managed with metformin 500 mg daily BID.  In the past we try to get him approved for [facility] and Trulicity but his [employer] does not cover any injectables whatsoever. During his last exam his A1c increased to 7.5 - Metformin was increased to BID dosing. He reports that he never increased and has been only doing one tab in the morning. He is been working on diet and eating less and watching what he eats.  Wt Readings from Last 3 Encounters:  03/23/22 247 lb (112 kg)  12/22/21 252 lb (114.3 kg)  06/23/21 248 lb (112.5 kg)   Lab Results  Component Value Date   HGBA1C 7.5 (A) 12/22/2021   Wt Readings from Last 3 Encounters:  03/23/22 247 lb (112 kg)  12/22/21 252 lb (114.3 kg)  06/23/21 248 lb (112.5 kg)   HTN -Prescribed losartan 50 mg daily.  He denies dizziness, lightheadedness, blurred vision, or headaches. He has been checking his blood pressure at home and reports that his readings have been in the 140/80's.  BP Readings from Last 3 Encounters:  03/23/22 (!) 140/90  12/22/21 (!) 160/86  06/23/21 128/80    Review of Systems See HPI   Past Medical History:  Diagnosis Date   DM (diabetes mellitus) ([place])    Gout    Hemorrhoids    Hyperlipidemia    Hypertension     Social History   Socioeconomic History   Marital status: Single    Spouse name: Not on file   Number of children: Not on file   Years of education: Not on file   Highest education level: Not on file  Occupational History   Not on file  Tobacco Use   Smoking status: Never   Smokeless tobacco: Never  Substance and Sexual Activity   Alcohol use:  No   Drug use: No   Sexual activity: Not on file  Other Topics Concern   Not on file  Social History Narrative   Married   Two step kids   7 grandchildren    2 great grand kids         Social Determinants of Health   Financial Resource Strain: Not on file  Food Insecurity: Not on file  Transportation Needs: Not on file  Physical Activity: Not on file  Stress: Not on file  Social Connections: Not on file  Intimate Partner Violence: Not on file    Past Surgical History:  Procedure Laterality Date   HERNIA REPAIR     HIP ARTHRODESIS W/ ILIAC CREST BONE GRAFT     KNEE ARTHROPLASTY Left     Family History  Problem Relation Age of Onset   Diabetes Mother    Kidney failure Mother    Hyperlipidemia Father    Pancreatic cancer Father     No Known Allergies  Current Outpatient Medications on File Prior to Visit  Medication Sig Dispense Refill   allopurinol (ZYLOPRIM) 300 MG tablet Take 1 tablet (300 mg total) by mouth daily. 90 tablet 3   atorvastatin (LIPITOR) 10 MG tablet Take 1 tablet (10 mg total) by mouth daily. 90 tablet 3  losartan (COZAAR) 50 MG tablet Take 1 tablet by mouth once daily 30 tablet 0   metFORMIN (GLUCOPHAGE) 500 MG tablet Take 1 tablet (500 mg total) by mouth 2 (two) times daily with a meal. 180 tablet 0   No current facility-administered medications on file prior to visit.    BP (!) 140/90   Pulse 67   Temp 97.6 F (36.4 C) (Oral)   Ht '5\' 8"'$  (1.727 m)   Wt 247 lb (112 kg)   SpO2 99%   BMI 37.56 kg/m       Objective:   Physical Exam Vitals and nursing note reviewed.  Constitutional:      Appearance: Normal appearance.  Cardiovascular:     Rate and Rhythm: Normal rate and regular rhythm.     Pulses: Normal pulses.     Heart sounds: Normal heart sounds.  Pulmonary:     Effort: Pulmonary effort is normal.     Breath sounds: Normal breath sounds.  Musculoskeletal:        General: Normal range of motion.  Skin:    General: Skin is  warm and dry.  Neurological:     General: No focal deficit present.     Mental Status: He is alert and oriented to person, place, and time.  Psychiatric:        Mood and Affect: Mood normal.        Behavior: Behavior normal.        Thought Content: Thought content normal.        Judgment: Judgment normal.       Assessment & Plan:  1. Essential hypertension - Will increase losartan to 100 mg daily  - Follow up as CPE   2. Type 2 diabetes mellitus with complication, without long-term current use of insulin (HCC)  - POC HgB A1c- 7.0  - Will have him increase to twice daily dosing  - Follow up as CPE   Dorothyann Peng, NP

## 2022-03-23 NOTE — Patient Instructions (Addendum)
Your A1c decreased to 7.0 - start taking the metformin twice a day   I have increased your losartan to 100 mg daily   Follow up after May 4th for your CPE

## 2022-03-24 NOTE — Telephone Encounter (Signed)
  The original prescription was discontinued on 03/23/2022 by Dorothyann Peng, NP. Renewing this prescription may not be appropriate.

## 2022-04-02 ENCOUNTER — Other Ambulatory Visit: Payer: Self-pay | Admitting: Adult Health

## 2022-04-02 DIAGNOSIS — E118 Type 2 diabetes mellitus with unspecified complications: Secondary | ICD-10-CM

## 2022-05-30 ENCOUNTER — Other Ambulatory Visit: Payer: Self-pay | Admitting: Adult Health

## 2022-05-30 DIAGNOSIS — E118 Type 2 diabetes mellitus with unspecified complications: Secondary | ICD-10-CM

## 2022-06-29 ENCOUNTER — Encounter: Payer: PRIVATE HEALTH INSURANCE | Admitting: Adult Health

## 2022-06-29 NOTE — Progress Notes (Deleted)
Subjective:    Patient ID: Dylan Estrada, male    DOB: 03-17-1960, 62 y.o.   MRN: [id number]  HPI Patient presents for yearly preventative medicine examination. He is a pleasant 62 year old male who  has a past medical history of DM (diabetes mellitus) (HCC), Gout, Hemorrhoids, Hyperlipidemia, and Hypertension.  DM Type 2 -controlled with metformin 500 mg daily.  In the past we have tried getting him approved for [facility] on Trulicity, his insurance company does not cover any injectables.  Lab Results  Component Value Date   HGBA1C 7.0 (A) 03/23/2022    HTN -prescribed losartan 25 mg daily.  He denies dizziness, lightheadedness, blurred vision, or headaches BP Readings from Last 3 Encounters:  03/23/22 (!) 140/90  12/22/21 (!) 160/86  06/23/21 128/80    Hyperlipidemia-takes Lipitor 10 mg daily.  He denies myalgia or fatigue Lab Results  Component Value Date   CHOL 145 06/23/2021   HDL 39.70 06/23/2021   LDLCALC 85 06/23/2021   LDLDIRECT 129.2 12/18/2013   TRIG 101.0 06/23/2021   CHOLHDL 4 06/23/2021    Gout -takes allopurinol 300 mg daily.  He denies any recent gout flares   All immunizations and health maintenance protocols were reviewed with the patient and needed orders were placed.  Appropriate screening laboratory values were ordered for the patient including screening of hyperlipidemia, renal function and hepatic function. If indicated by BPH, a PSA was ordered.  Medication reconciliation,  past medical history, social history, problem list and allergies were reviewed in detail with the patient  Goals were established with regard to weight loss, exercise, and  diet in compliance with medications  Review of Systems  Constitutional: Negative.   HENT: Negative.    Eyes: Negative.   Respiratory: Negative.    Cardiovascular: Negative.   Gastrointestinal: Negative.   Endocrine: Negative.   Genitourinary: Negative.   Musculoskeletal: Negative.   Skin: Negative.    Allergic/Immunologic: Negative.   Neurological: Negative.   Hematological: Negative.   Psychiatric/Behavioral: Negative.    All other systems reviewed and are negative.  Past Medical History:  Diagnosis Date   DM (diabetes mellitus) (HCC)    Gout    Hemorrhoids    Hyperlipidemia    Hypertension     Social History   Socioeconomic History   Marital status: Single    Spouse name: Not on file   Number of children: Not on file   Years of education: Not on file   Highest education level: Not on file  Occupational History   Not on file  Tobacco Use   Smoking status: Never   Smokeless tobacco: Never  Substance and Sexual Activity   Alcohol use: No   Drug use: No   Sexual activity: Not on file  Other Topics Concern   Not on file  Social History Narrative   Married   Two step kids   7 grandchildren    2 great grand kids         Social Determinants of Health   Financial Resource Strain: Not on file  Food Insecurity: Not on file  Transportation Needs: Not on file  Physical Activity: Not on file  Stress: Not on file  Social Connections: Not on file  Intimate Partner Violence: Not on file    Past Surgical History:  Procedure Laterality Date   HERNIA REPAIR     HIP ARTHRODESIS W/ ILIAC CREST BONE GRAFT     KNEE ARTHROPLASTY Left     Family  History  Problem Relation Age of Onset   Diabetes Mother    Kidney failure Mother    Hyperlipidemia Father    Pancreatic cancer Father     No Known Allergies  Current Outpatient Medications on File Prior to Visit  Medication Sig Dispense Refill   allopurinol (ZYLOPRIM) 300 MG tablet Take 1 tablet (300 mg total) by mouth daily. 90 tablet 3   atorvastatin (LIPITOR) 10 MG tablet Take 1 tablet (10 mg total) by mouth daily. 90 tablet 3   losartan (COZAAR) 100 MG tablet Take 1 tablet (100 mg total) by mouth daily. 90 tablet 3   metFORMIN (GLUCOPHAGE) 500 MG tablet Take 1 tablet by mouth once daily with breakfast 90 tablet 0    No current facility-administered medications on file prior to visit.    There were no vitals taken for this visit.      Objective:   Physical Exam Vitals and nursing note reviewed.  Constitutional:      General: He is not in acute distress.    Appearance: Normal appearance. He is not ill-appearing.  HENT:     Head: Normocephalic and atraumatic.     Right Ear: Tympanic membrane, ear canal and external ear normal. There is no impacted cerumen.     Left Ear: Tympanic membrane, ear canal and external ear normal. There is no impacted cerumen.     Nose: Nose normal. No congestion or rhinorrhea.     Mouth/Throat:     Mouth: Mucous membranes are moist.     Pharynx: Oropharynx is clear.  Eyes:     Extraocular Movements: Extraocular movements intact.     Conjunctiva/sclera: Conjunctivae normal.     Pupils: Pupils are equal, round, and reactive to light.  Neck:     Vascular: No carotid bruit.  Cardiovascular:     Rate and Rhythm: Normal rate and regular rhythm.     Pulses: Normal pulses.     Heart sounds: No murmur heard.    No friction rub. No gallop.  Pulmonary:     Effort: Pulmonary effort is normal.     Breath sounds: Normal breath sounds.  Abdominal:     General: Abdomen is flat. Bowel sounds are normal. There is no distension.     Palpations: Abdomen is soft. There is no mass.     Tenderness: There is no abdominal tenderness. There is no guarding or rebound.     Hernia: No hernia is present.  Musculoskeletal:        General: Normal range of motion.     Cervical back: Normal range of motion and neck supple.  Lymphadenopathy:     Cervical: No cervical adenopathy.  Skin:    General: Skin is warm and dry.     Capillary Refill: Capillary refill takes less than 2 seconds.  Neurological:     General: No focal deficit present.     Mental Status: He is alert and oriented to person, place, and time.  Psychiatric:        Mood and Affect: Mood normal.        Behavior: Behavior  normal.        Thought Content: Thought content normal.        Judgment: Judgment normal.       Assessment & Plan:

## 2022-07-12 ENCOUNTER — Ambulatory Visit: Payer: PRIVATE HEALTH INSURANCE | Admitting: Adult Health

## 2022-07-13 ENCOUNTER — Encounter: Payer: Self-pay | Admitting: Adult Health

## 2022-07-13 ENCOUNTER — Ambulatory Visit (INDEPENDENT_AMBULATORY_CARE_PROVIDER_SITE_OTHER): Payer: PRIVATE HEALTH INSURANCE | Admitting: Adult Health

## 2022-07-13 ENCOUNTER — Ambulatory Visit (INDEPENDENT_AMBULATORY_CARE_PROVIDER_SITE_OTHER): Payer: PRIVATE HEALTH INSURANCE

## 2022-07-13 VITALS — BP 130/88 | HR 58 | Temp 98.1°F | Ht 67.25 in | Wt 248.0 lb

## 2022-07-13 DIAGNOSIS — I10 Essential (primary) hypertension: Secondary | ICD-10-CM | POA: Diagnosis not present

## 2022-07-13 DIAGNOSIS — Z125 Encounter for screening for malignant neoplasm of prostate: Secondary | ICD-10-CM

## 2022-07-13 DIAGNOSIS — E782 Mixed hyperlipidemia: Secondary | ICD-10-CM

## 2022-07-13 DIAGNOSIS — Z7984 Long term (current) use of oral hypoglycemic drugs: Secondary | ICD-10-CM

## 2022-07-13 DIAGNOSIS — Z0001 Encounter for general adult medical examination with abnormal findings: Secondary | ICD-10-CM | POA: Diagnosis not present

## 2022-07-13 DIAGNOSIS — M25512 Pain in left shoulder: Secondary | ICD-10-CM

## 2022-07-13 DIAGNOSIS — Z0000 Encounter for general adult medical examination without abnormal findings: Secondary | ICD-10-CM

## 2022-07-13 DIAGNOSIS — E118 Type 2 diabetes mellitus with unspecified complications: Secondary | ICD-10-CM | POA: Diagnosis not present

## 2022-07-13 DIAGNOSIS — M10071 Idiopathic gout, right ankle and foot: Secondary | ICD-10-CM

## 2022-07-13 MED ORDER — ALLOPURINOL 300 MG PO TABS: 300.0000 mg | ORAL_TABLET | Freq: Every day | ORAL | 3 refills | Status: DC

## 2022-07-13 NOTE — Patient Instructions (Signed)
It was great seeing you today   We will follow up with you regarding your lab work   Please let me know if you need anything   

## 2022-07-13 NOTE — Progress Notes (Signed)
Subjective:    Patient ID: Dylan Estrada, male    DOB: 12/09/1960, 62 y.o.   MRN: [id number]  HPI  Patient presents for yearly preventative medicine examination. He is a pleasant 62 year old male who  has a past medical history of DM (diabetes mellitus) (HCC), Gout, Hemorrhoids, Hyperlipidemia, and Hypertension.  DM Type 2 -controlled with metformin 500 mg daily.  In the past we have tried getting him approved for [facility] on [employer], his insurance company does not cover any injectables.  Lab Results  Component Value Date   HGBA1C 7.0 (A) 03/23/2022   HTN -prescribed losartan 100 mg daily.  He denies dizziness, lightheadedness, blurred vision, or headaches. He has been checking his blood pressure at home with a wrist cuff and is getting readings in the 140-150. Today in the clinic his BP cuff showed a reading of 170/117. We had three separate readings of 130/80's.  BP Readings from Last 3 Encounters:  07/13/22 130/88  03/23/22 (!) 140/90  12/22/21 (!) 160/86   Hyperlipidemia-takes Lipitor 10 mg daily.  He denies myalgia or fatigue Lab Results  Component Value Date   CHOL 145 06/23/2021   HDL 39.70 06/23/2021   LDLCALC 85 06/23/2021   LDLDIRECT 129.2 12/18/2013   TRIG 101.0 06/23/2021   CHOLHDL 4 06/23/2021   Gout -takes allopurinol 300 mg daily.  He denies any recent gout flares  Acute Left shoulder pain -for the last 2 months he has been experiencing left-sided shoulder pain.  Pain is worse with certain movements as well as when he sleeps on his left shoulder.  He has loss of range of motion with reaching behind himself and reaching overhead.  Does have a history of rotator cuff repair to the right side and feels as though this is the same type of pain.  He denies trauma or injury.   All immunizations and health maintenance protocols were reviewed with the patient and needed orders were placed.  Appropriate screening laboratory values were ordered for the patient including  screening of hyperlipidemia, renal function and hepatic function. If indicated by BPH, a PSA was ordered.  Medication reconciliation,  past medical history, social history, problem list and allergies were reviewed in detail with the patient  Goals were established with regard to weight loss, exercise, and  diet in compliance with medications Wt Readings from Last 3 Encounters:  07/13/22 248 lb (112.5 kg)  03/23/22 247 lb (112 kg)  12/22/21 252 lb (114.3 kg)   Review of Systems  Constitutional: Negative.   HENT: Negative.    Eyes: Negative.   Respiratory: Negative.    Cardiovascular: Negative.   Gastrointestinal: Negative.   Endocrine: Negative.   Genitourinary: Negative.   Musculoskeletal:  Positive for arthralgias.  Skin: Negative.   Allergic/Immunologic: Negative.   Neurological: Negative.   Hematological: Negative.   Psychiatric/Behavioral: Negative.    All other systems reviewed and are negative.  Past Medical History:  Diagnosis Date   DM (diabetes mellitus) (HCC)    Gout    Hemorrhoids    Hyperlipidemia    Hypertension     Social History   Socioeconomic History   Marital status: Single    Spouse name: Not on file   Number of children: Not on file   Years of education: Not on file   Highest education level: Not on file  Occupational History   Not on file  Tobacco Use   Smoking status: Never   Smokeless tobacco: Never  Substance and  Sexual Activity   Alcohol use: No   Drug use: No   Sexual activity: Not on file  Other Topics Concern   Not on file  Social History Narrative   Married   Two step kids   7 grandchildren    2 great grand kids         Social Determinants of Health   Financial Resource Strain: Not on file  Food Insecurity: Not on file  Transportation Needs: Not on file  Physical Activity: Not on file  Stress: Not on file  Social Connections: Not on file  Intimate Partner Violence: Not on file    Past Surgical History:  Procedure  Laterality Date   HERNIA REPAIR     HIP ARTHRODESIS W/ ILIAC CREST BONE GRAFT     KNEE ARTHROPLASTY Left     Family History  Problem Relation Age of Onset   Diabetes Mother    Kidney failure Mother    Hyperlipidemia Father    Pancreatic cancer Father     No Known Allergies  Current Outpatient Medications on File Prior to Visit  Medication Sig Dispense Refill   allopurinol (ZYLOPRIM) 300 MG tablet Take 1 tablet (300 mg total) by mouth daily. 90 tablet 3   atorvastatin (LIPITOR) 10 MG tablet Take 1 tablet (10 mg total) by mouth daily. 90 tablet 3   losartan (COZAAR) 100 MG tablet Take 1 tablet (100 mg total) by mouth daily. 90 tablet 3   metFORMIN (GLUCOPHAGE) 500 MG tablet Take 1 tablet by mouth once daily with breakfast 90 tablet 0   No current facility-administered medications on file prior to visit.    BP 130/88   Pulse (!) 58   Temp 98.1 F (36.7 C) (Oral)   Ht 5' 7.25" (1.708 m)   Wt 248 lb (112.5 kg)   SpO2 98%   BMI 38.55 kg/m       Objective:   Physical Exam Vitals and nursing note reviewed.  Constitutional:      General: He is not in acute distress.    Appearance: Normal appearance. He is not ill-appearing.  HENT:     Head: Normocephalic and atraumatic.     Right Ear: Tympanic membrane, ear canal and external ear normal. There is no impacted cerumen.     Left Ear: Tympanic membrane, ear canal and external ear normal. There is no impacted cerumen.     Nose: Nose normal. No congestion or rhinorrhea.     Mouth/Throat:     Mouth: Mucous membranes are moist.     Pharynx: Oropharynx is clear.  Eyes:     Extraocular Movements: Extraocular movements intact.     Conjunctiva/sclera: Conjunctivae normal.     Pupils: Pupils are equal, round, and reactive to light.  Neck:     Vascular: No carotid bruit.  Cardiovascular:     Rate and Rhythm: Normal rate and regular rhythm.     Pulses: Normal pulses.     Heart sounds: No murmur heard.    No friction rub. No  gallop.  Pulmonary:     Effort: Pulmonary effort is normal.     Breath sounds: Normal breath sounds.  Abdominal:     General: Abdomen is flat. Bowel sounds are normal. There is no distension.     Palpations: Abdomen is soft. There is no mass.     Tenderness: There is no abdominal tenderness. There is no guarding or rebound.     Hernia: No hernia is present.  Musculoskeletal:  Left shoulder: No tenderness, bony tenderness or crepitus. Decreased range of motion. Decreased strength.     Cervical back: Normal range of motion and neck supple.     Comments: Able to perform back scratch test or adequately raise his arm over his head.  Empty can test was performed without difficulty  Lymphadenopathy:     Cervical: No cervical adenopathy.  Skin:    General: Skin is warm and dry.     Capillary Refill: Capillary refill takes less than 2 seconds.  Neurological:     General: No focal deficit present.     Mental Status: He is alert and oriented to person, place, and time.  Psychiatric:        Mood and Affect: Mood normal.        Behavior: Behavior normal.        Thought Content: Thought content normal.        Judgment: Judgment normal.        Assessment & Plan:  1. Routine general medical examination at a health care facility Today patient counseled on age appropriate routine health concerns for screening and prevention, each reviewed and up to date or declined. Immunizations reviewed and up to date or declined. Labs ordered and reviewed. Risk factors for depression reviewed and negative. Hearing function and visual acuity are intact. ADLs screened and addressed as needed. Functional ability and level of safety reviewed and appropriate. Education, counseling and referrals performed based on assessed risks today. Patient provided with a copy of personalized plan for preventive services. - Encouraged weight loss through diet and exercise - Follow up in one year or sooner if needed  2. Type 2  diabetes mellitus with complication, without long-term current use of insulin (HCC) - Consider increase in Metformin  - Follow up in 3 months  - Lipid panel; Future - TSH; Future - CBC; Future - Comprehensive metabolic panel; Future - Hemoglobin A1c; Future - Microalbumin/Creatinine Ratio, Urine; Future - Microalbumin/Creatinine Ratio, Urine - Hemoglobin A1c - Comprehensive metabolic panel - CBC - TSH - Lipid panel  3. Essential hypertension - Advised to get upper arm BP cuff for more accurate readings  - No change in BP medication  - Lipid panel; Future - TSH; Future - CBC; Future - Comprehensive metabolic panel; Future - Hemoglobin A1c; Future - Microalbumin/Creatinine Ratio, Urine; Future - Microalbumin/Creatinine Ratio, Urine - Hemoglobin A1c - Comprehensive metabolic panel - CBC - TSH - Lipid panel  4. Acute idiopathic gout of right foot  - allopurinol (ZYLOPRIM) 300 MG tablet; Take 1 tablet (300 mg total) by mouth daily.  Dispense: 90 tablet; Refill: 3  5. Prostate cancer screening  - PSA; Future - PSA  6. Mixed hyperlipidemia - Consider increase in statin  - Lipid panel; Future - TSH; Future - CBC; Future - Comprehensive metabolic panel; Future - Comprehensive metabolic panel - CBC - TSH - Lipid panel  7. Acute pain of left shoulder - Likely rotator cuff injury. Will order xray today and then MRI  - DG Shoulder Left; Future

## 2022-07-14 LAB — COMPREHENSIVE METABOLIC PANEL
ALT: 19 U/L (ref 0–53)
AST: 14 U/L (ref 0–37)
Albumin: 4.2 g/dL (ref 3.5–5.2)
Alkaline Phosphatase: 86 U/L (ref 39–117)
BUN: 15 mg/dL (ref 6–23)
CO2: 29 mEq/L (ref 19–32)
Calcium: 9.1 mg/dL (ref 8.4–10.5)
Chloride: 103 mEq/L (ref 96–112)
Creatinine, Ser: 0.82 mg/dL (ref 0.40–1.50)
GFR: 95.05 mL/min (ref >60.00)
Glucose, Bld: 103 mg/dL — ABNORMAL HIGH (ref 70–99)
Potassium: 4.4 mEq/L (ref 3.5–5.1)
Sodium: 141 mEq/L (ref 135–145)
Total Bilirubin: 0.7 mg/dL (ref 0.2–1.2)
Total Protein: 6.8 g/dL (ref 6.0–8.3)

## 2022-07-14 LAB — LIPID PANEL
Cholesterol: 143 mg/dL (ref 0–200)
HDL: 46.2 mg/dL (ref >39.00)
LDL Cholesterol: 74 mg/dL (ref 0–99)
NonHDL: 97.26
Total CHOL/HDL Ratio: 3
Triglycerides: 114 mg/dL (ref 0.0–149.0)
VLDL: 22.8 mg/dL (ref 0.0–40.0)

## 2022-07-14 LAB — CBC
HCT: 42 % (ref 39.0–52.0)
Hemoglobin: 13.9 g/dL (ref 13.0–17.0)
MCHC: 33.1 g/dL (ref 30.0–36.0)
MCV: 90.1 fl (ref 78.0–100.0)
Platelets: 188 10*3/uL (ref 150.0–400.0)
RBC: 4.66 Mil/uL (ref 4.22–5.81)
RDW: 13.8 % (ref 11.5–15.5)
WBC: 8.6 10*3/uL (ref 4.0–10.5)

## 2022-07-14 LAB — TSH: TSH: 4.2 u[IU]/mL (ref 0.35–5.50)

## 2022-07-14 LAB — HEMOGLOBIN A1C: Hgb A1c MFr Bld: 7.6 % — ABNORMAL HIGH (ref 4.6–6.5)

## 2022-07-14 LAB — MICROALBUMIN / CREATININE URINE RATIO
Creatinine,U: 96 mg/dL
Microalb Creat Ratio: 0.7 mg/g (ref 0.0–30.0)
Microalb, Ur: <0.7 mg/dL (ref 0.0–1.9)

## 2022-07-14 LAB — PSA: PSA: 0.15 ng/mL (ref 0.10–4.00)

## 2022-07-18 ENCOUNTER — Other Ambulatory Visit: Payer: Self-pay | Admitting: Adult Health

## 2022-07-18 DIAGNOSIS — E118 Type 2 diabetes mellitus with unspecified complications: Secondary | ICD-10-CM

## 2022-07-18 DIAGNOSIS — M25512 Pain in left shoulder: Secondary | ICD-10-CM

## 2022-07-18 MED ORDER — METFORMIN HCL 500 MG PO TABS
500.0000 mg | ORAL_TABLET | Freq: Two times a day (BID) | ORAL | 0 refills | Status: DC
Start: 2022-07-18 — End: 2022-08-31

## 2022-07-20 ENCOUNTER — Other Ambulatory Visit: Payer: Self-pay | Admitting: Adult Health

## 2022-07-20 DIAGNOSIS — E782 Mixed hyperlipidemia: Secondary | ICD-10-CM

## 2022-08-12 ENCOUNTER — Other Ambulatory Visit: Payer: Self-pay | Admitting: Adult Health

## 2022-08-12 DIAGNOSIS — E782 Mixed hyperlipidemia: Secondary | ICD-10-CM

## 2022-08-31 ENCOUNTER — Other Ambulatory Visit: Payer: Self-pay | Admitting: Adult Health

## 2022-08-31 DIAGNOSIS — E118 Type 2 diabetes mellitus with unspecified complications: Secondary | ICD-10-CM

## 2022-09-12 ENCOUNTER — Other Ambulatory Visit: Payer: Self-pay | Admitting: Adult Health

## 2022-09-12 DIAGNOSIS — E782 Mixed hyperlipidemia: Secondary | ICD-10-CM

## 2022-09-20 ENCOUNTER — Encounter (INDEPENDENT_AMBULATORY_CARE_PROVIDER_SITE_OTHER): Payer: Self-pay

## 2022-10-10 ENCOUNTER — Other Ambulatory Visit: Payer: Self-pay | Admitting: Adult Health

## 2022-10-10 DIAGNOSIS — E782 Mixed hyperlipidemia: Secondary | ICD-10-CM

## 2022-10-12 ENCOUNTER — Ambulatory Visit: Payer: PRIVATE HEALTH INSURANCE | Admitting: Adult Health

## 2022-10-12 ENCOUNTER — Encounter: Payer: Self-pay | Admitting: Adult Health

## 2022-10-12 ENCOUNTER — Ambulatory Visit (INDEPENDENT_AMBULATORY_CARE_PROVIDER_SITE_OTHER): Payer: PRIVATE HEALTH INSURANCE | Admitting: Adult Health

## 2022-10-12 VITALS — BP 120/80 | HR 60 | Temp 98.3°F | Ht 67.5 in | Wt 244.0 lb

## 2022-10-12 DIAGNOSIS — E119 Type 2 diabetes mellitus without complications: Secondary | ICD-10-CM

## 2022-10-12 DIAGNOSIS — E782 Mixed hyperlipidemia: Secondary | ICD-10-CM | POA: Diagnosis not present

## 2022-10-12 DIAGNOSIS — Z7984 Long term (current) use of oral hypoglycemic drugs: Secondary | ICD-10-CM

## 2022-10-12 DIAGNOSIS — I10 Essential (primary) hypertension: Secondary | ICD-10-CM | POA: Diagnosis not present

## 2022-10-12 LAB — POCT GLYCOSYLATED HEMOGLOBIN (HGB A1C): Hemoglobin A1C: 6.8 % — AB (ref 4.0–5.6)

## 2022-10-12 MED ORDER — ATORVASTATIN CALCIUM 10 MG PO TABS
10.0000 mg | ORAL_TABLET | Freq: Every day | ORAL | 3 refills | Status: AC
Start: 2022-10-12 — End: ?

## 2022-10-12 NOTE — Patient Instructions (Addendum)
It was great seeing you today   Your A1c dropped to 6.8! Please work on taking the night time dose nightly.   I will see you back in 3 months

## 2022-10-12 NOTE — Progress Notes (Signed)
Subjective:    Patient ID: Dylan Estrada, male    DOB: [birth year], 61 y.o.   MRN: [id number]  HPI 61 year old male who  has a past medical history of DM (diabetes mellitus) (HCC), Gout, Hemorrhoids, Hyperlipidemia, and Hypertension.  DM Type 2 -controlled with metformin 500 mg BID. He often forgets to take the evening dose.   In the past we have tried getting him approved for [facility] on [employer], his insurance company does not cover any injectables.His last A1c was 7.6 - three months ago.   Lab Results  Component Value Date   HGBA1C 6.8 (A) 10/12/2022   Wt Readings from Last 3 Encounters:  10/12/22 244 lb (110.7 kg)  07/13/22 248 lb (112.5 kg)  03/23/22 247 lb (112 kg)   HTN -prescribed losartan 100 mg daily.  He denies dizziness, lightheadedness, blurred vision, or headaches. He has been checking his blood pressure at home with a wrist cuff and is getting readings in the 140-150. Today in the clinic his BP cuff showed a reading of 170/117. We had three separate readings of 130/80's.  BP Readings from Last 3 Encounters:  10/12/22 120/80  07/13/22 130/88  03/23/22 (!) 140/90   He needs a refill of Atorvastatin   Review of Systems See HPI   Past Medical History:  Diagnosis Date   DM (diabetes mellitus) (HCC)    Gout    Hemorrhoids    Hyperlipidemia    Hypertension     Social History   Socioeconomic History   Marital status: Single    Spouse name: Not on file   Number of children: Not on file   Years of education: Not on file   Highest education level: Not on file  Occupational History   Not on file  Tobacco Use   Smoking status: Never   Smokeless tobacco: Never  Substance and Sexual Activity   Alcohol use: No   Drug use: No   Sexual activity: Not on file  Other Topics Concern   Not on file  Social History Narrative   Married   Two step kids   7 grandchildren    2 great grand kids         Social Determinants of Health   Financial Resource Strain:  Not on file  Food Insecurity: Not on file  Transportation Needs: Not on file  Physical Activity: Not on file  Stress: Not on file  Social Connections: Unknown (07/05/2021)   Received [facility]   Social Network    Social Network: Not on file  Intimate Partner Violence: Unknown (05/26/2021)   Received from [facility]   HITS    Physically Hurt: Not on file    Insult or Talk Down To: Not on file    Threaten Physical Harm: Not on file    Scream or Curse: Not on file    Past Surgical History:  Procedure Laterality Date   HERNIA REPAIR     HIP ARTHRODESIS W/ ILIAC CREST BONE GRAFT     KNEE ARTHROPLASTY Left     Family History  Problem Relation Age of Onset   Diabetes Mother    Kidney failure Mother    Hyperlipidemia Father    Pancreatic cancer Father     No Known Allergies  Current Outpatient Medications on File Prior to Visit  Medication Sig Dispense Refill   allopurinol (ZYLOPRIM) 300 MG tablet Take 1 tablet (300 mg total) by mouth daily. 90 tablet 3  atorvastatin (LIPITOR) 10 MG tablet Take 1 tablet by mouth once daily 30 tablet 0   losartan (COZAAR) 100 MG tablet Take 1 tablet (100 mg total) by mouth daily. 90 tablet 3   metFORMIN (GLUCOPHAGE) 500 MG tablet Take 1 tablet by mouth once daily with breakfast 90 tablet 0   No current facility-administered medications on file prior to visit.    BP 120/80   Pulse 60   Temp 98.3 F (36.8 C) (Oral)   Ht 5' 7.5" (1.715 m)   Wt 244 lb (110.7 kg)   SpO2 98%   BMI 37.65 kg/m       Objective:   Physical Exam Vitals and nursing note reviewed.  Constitutional:      Appearance: Normal appearance. He is obese.  Cardiovascular:     Rate and Rhythm: Normal rate and regular rhythm.     Pulses: Normal pulses.     Heart sounds: Normal heart sounds.  Pulmonary:     Effort: Pulmonary effort is normal.     Breath sounds: Normal breath sounds.  Skin:    General: Skin is warm and dry.  Neurological:     General: No  focal deficit present.     Mental Status: He is alert and oriented to person, place, and time.  Psychiatric:        Mood and Affect: Mood normal.        Behavior: Behavior normal.        Thought Content: Thought content normal.        Judgment: Judgment normal.        Assessment & Plan:  1. Diabetes mellitus treated with oral medication (HCC)  - POC HgB A1c- 6.8  - Encouraged to set a timer on his phone to remind him to take evening dose - Follow up in 3 months   2. Essential hypertension - Well controlled.  - No change in medication   3. Mixed hyperlipidemia  - atorvastatin (LIPITOR) 10 MG tablet; Take 1 tablet (10 mg total) by mouth daily.  Dispense: 90 tablet; Refill: 3

## 2022-11-24 ENCOUNTER — Other Ambulatory Visit: Payer: Self-pay | Admitting: Adult Health

## 2022-11-24 DIAGNOSIS — Z1211 Encounter for screening for malignant neoplasm of colon: Secondary | ICD-10-CM

## 2022-11-24 DIAGNOSIS — Z1212 Encounter for screening for malignant neoplasm of rectum: Secondary | ICD-10-CM

## 2023-01-09 ENCOUNTER — Other Ambulatory Visit: Payer: Self-pay | Admitting: Adult Health

## 2023-01-09 DIAGNOSIS — E118 Type 2 diabetes mellitus with unspecified complications: Secondary | ICD-10-CM

## 2023-01-23 ENCOUNTER — Encounter: Payer: Self-pay | Admitting: Adult Health

## 2023-01-23 ENCOUNTER — Ambulatory Visit (INDEPENDENT_AMBULATORY_CARE_PROVIDER_SITE_OTHER): Payer: PRIVATE HEALTH INSURANCE | Admitting: Adult Health

## 2023-01-23 VITALS — BP 130/84 | HR 64 | Temp 98.2°F | Ht 67.5 in | Wt 237.0 lb

## 2023-01-23 DIAGNOSIS — I10 Essential (primary) hypertension: Secondary | ICD-10-CM | POA: Diagnosis not present

## 2023-01-23 DIAGNOSIS — Z7984 Long term (current) use of oral hypoglycemic drugs: Secondary | ICD-10-CM

## 2023-01-23 DIAGNOSIS — Z1211 Encounter for screening for malignant neoplasm of colon: Secondary | ICD-10-CM

## 2023-01-23 DIAGNOSIS — E119 Type 2 diabetes mellitus without complications: Secondary | ICD-10-CM

## 2023-01-23 DIAGNOSIS — Z6836 Body mass index (BMI) 36.0-36.9, adult: Secondary | ICD-10-CM

## 2023-01-23 LAB — POCT GLYCOSYLATED HEMOGLOBIN (HGB A1C): Hemoglobin A1C: 6.5 % — AB (ref 4.0–5.6)

## 2023-01-23 MED ORDER — METFORMIN HCL 500 MG PO TABS
500.0000 mg | ORAL_TABLET | Freq: Two times a day (BID) | ORAL | 1 refills | Status: DC
Start: 2023-01-23 — End: 2023-07-27

## 2023-01-23 NOTE — Progress Notes (Signed)
Subjective:    Patient ID: Dylan Estrada, male    DOB: Apr 11, 1960, 62 y.o.   MRN: [id number]  HPI  DM Type 2 -controlled with metformin 500 mg BID. He is taking his metformin more frequently. In the past we have tried getting him approved for [facility] on [employer], his insurance company does not cover any injectables. He has been trying to eat healthier and smaller portions. He does not exercise outside of work   Lab Results  Component Value Date   HGBA1C 6.8 (A) 10/12/2022   HGBA1C 7.6 (H) 07/13/2022   HGBA1C 7.0 (A) 03/23/2022   Wt Readings from Last 3 Encounters:  01/23/23 237 lb (107.5 kg)  10/12/22 244 lb (110.7 kg)  07/13/22 248 lb (112.5 kg)   HTN -prescribed losartan 100 mg daily.  He denies dizziness, lightheadedness, blurred vision, or headaches.  BP Readings from Last 3 Encounters:  01/23/23 130/84  10/12/22 120/80  07/13/22 130/88   Review of Systems See HPI   Past Medical History:  Diagnosis Date   DM (diabetes mellitus) (HCC)    Gout    Hemorrhoids    Hyperlipidemia    Hypertension     Social History   Socioeconomic History   Marital status: Single    Spouse name: Not on file   Number of children: Not on file   Years of education: Not on file   Highest education level: Not on file  Occupational History   Not on file  Tobacco Use   Smoking status: Never   Smokeless tobacco: Never  Substance and Sexual Activity   Alcohol use: No   Drug use: No   Sexual activity: Not on file  Other Topics Concern   Not on file  Social History Narrative   Married   Two step kids   7 grandchildren    2 great grand kids         Social Determinants of Health   Financial Resource Strain: Not on file  Food Insecurity: Not on file  Transportation Needs: Not on file  Physical Activity: Not on file  Stress: Not on file  Social Connections: Unknown (07/05/2021)   Received from [facility], [facility]   Social Network    Social Network: Not on file   Intimate Partner Violence: Unknown (05/26/2021)   Received from [facility], [facility]   HITS    Physically Hurt: Not on file    Insult or Talk Down To: Not on file    Threaten Physical Harm: Not on file    Scream or Curse: Not on file    Past Surgical History:  Procedure Laterality Date   HERNIA REPAIR     HIP ARTHRODESIS W/ ILIAC CREST BONE GRAFT     KNEE ARTHROPLASTY Left     Family History  Problem Relation Age of Onset   Diabetes Mother    Kidney failure Mother    Hyperlipidemia Father    Pancreatic cancer Father     No Known Allergies  Current Outpatient Medications on File Prior to Visit  Medication Sig Dispense Refill   allopurinol (ZYLOPRIM) 300 MG tablet Take 1 tablet (300 mg total) by mouth daily. 90 tablet 3   atorvastatin (LIPITOR) 10 MG tablet Take 1 tablet (10 mg total) by mouth daily. 90 tablet 3   losartan (COZAAR) 100 MG tablet Take 1 tablet (100 mg total) by mouth daily. 90 tablet 3   metFORMIN (GLUCOPHAGE) 500 MG tablet TAKE 1 TABLET BY  MOUTH TWICE DAILY WITH A MEAL 60 tablet 0   No current facility-administered medications on file prior to visit.    BP 130/84   Pulse 64   Temp 98.2 F (36.8 C) (Oral)   Ht 5' 7.5" (1.715 m)   Wt 237 lb (107.5 kg)   SpO2 97%   BMI 36.57 kg/m       Objective:   Physical Exam Vitals and nursing note reviewed.  Constitutional:      Appearance: Normal appearance. He is obese.  Cardiovascular:     Rate and Rhythm: Normal rate and regular rhythm.     Pulses: Normal pulses.     Heart sounds: Normal heart sounds.  Pulmonary:     Effort: Pulmonary effort is normal.     Breath sounds: Normal breath sounds.  Skin:    General: Skin is warm and dry.  Neurological:     General: No focal deficit present.     Mental Status: He is alert and oriented to person, place, and time.  Psychiatric:        Mood and Affect: Mood normal.        Behavior: Behavior normal.        Thought Content: Thought content normal.         Judgment: Judgment normal.       Assessment & Plan:  1. Diabetes mellitus treated with oral medication (HCC)  - POC HgB A1c- 6.5 - at goal  - Continue with healthy eating and start exercising  - Follow up in 6 months for CPE  - metFORMIN (GLUCOPHAGE) 500 MG tablet; Take 1 tablet (500 mg total) by mouth 2 (two) times daily with a meal.  Dispense: 180 tablet; Refill: 1  2. Essential hypertension - Controlled. No change in medication   3. Screening for colon cancer  - Cologuard  4. BMI 36.0-36.9,adult - he has lost 7 pounds over the last three months  - Continue with lifestyle modifications   Shirline Frees, NP

## 2023-01-23 NOTE — Patient Instructions (Signed)
Your A1c is 6.5 - this is at goal   Please follow up after May 23rd for your physical exam

## 2023-02-27 ENCOUNTER — Other Ambulatory Visit: Payer: Self-pay | Admitting: Adult Health

## 2023-02-27 DIAGNOSIS — I10 Essential (primary) hypertension: Secondary | ICD-10-CM

## 2023-03-23 LAB — COLOGUARD: COLOGUARD: NEGATIVE

## 2023-03-27 ENCOUNTER — Encounter: Payer: Self-pay | Admitting: Adult Health

## 2023-05-28 ENCOUNTER — Other Ambulatory Visit: Payer: Self-pay | Admitting: Adult Health

## 2023-05-28 DIAGNOSIS — I10 Essential (primary) hypertension: Secondary | ICD-10-CM

## 2023-06-23 ENCOUNTER — Other Ambulatory Visit: Payer: Self-pay | Admitting: Adult Health

## 2023-06-23 DIAGNOSIS — M10071 Idiopathic gout, right ankle and foot: Secondary | ICD-10-CM

## 2023-07-03 ENCOUNTER — Encounter: Payer: Self-pay | Admitting: Adult Health

## 2023-07-03 ENCOUNTER — Ambulatory Visit (INDEPENDENT_AMBULATORY_CARE_PROVIDER_SITE_OTHER): Payer: PRIVATE HEALTH INSURANCE | Admitting: Adult Health

## 2023-07-03 VITALS — BP 130/80 | HR 64 | Temp 98.3°F | Ht 67.5 in | Wt 238.0 lb

## 2023-07-03 DIAGNOSIS — I10 Essential (primary) hypertension: Secondary | ICD-10-CM | POA: Diagnosis not present

## 2023-07-03 DIAGNOSIS — E119 Type 2 diabetes mellitus without complications: Secondary | ICD-10-CM | POA: Diagnosis not present

## 2023-07-03 DIAGNOSIS — Z0000 Encounter for general adult medical examination without abnormal findings: Secondary | ICD-10-CM

## 2023-07-03 DIAGNOSIS — R351 Nocturia: Secondary | ICD-10-CM | POA: Diagnosis not present

## 2023-07-03 DIAGNOSIS — Z6836 Body mass index (BMI) 36.0-36.9, adult: Secondary | ICD-10-CM | POA: Diagnosis not present

## 2023-07-03 DIAGNOSIS — Z7984 Long term (current) use of oral hypoglycemic drugs: Secondary | ICD-10-CM | POA: Diagnosis not present

## 2023-07-03 DIAGNOSIS — N401 Enlarged prostate with lower urinary tract symptoms: Secondary | ICD-10-CM | POA: Diagnosis not present

## 2023-07-03 DIAGNOSIS — E782 Mixed hyperlipidemia: Secondary | ICD-10-CM

## 2023-07-03 NOTE — Progress Notes (Signed)
 Subjective:    Patient ID: Dylan Estrada, male    DOB: 1960/12/09, 63 y.o.   MRN: [id number]  HPI Patient presents for yearly preventative medicine examination. He is a pleasant 63 year old male who  has a past medical history of DM (diabetes mellitus) (HCC), Gout, Hemorrhoids, Hyperlipidemia, and Hypertension.  DM Type 2 -controlled with metformin  500 mg BID ( lately he has not been taking metformin  in the afternoon due to being busy at work).Aaron Aas He is taking his metformin  more frequently. In the past we have tried getting him approved for Mounjaro  on Trulicity, his insurance company does not cover any injectables. He has been trying to eat healthier and smaller portions. He does not exercise outside of work  Lab Results  Component Value Date   HGBA1C 6.5 (A) 01/23/2023   Wt Readings from Last 10 Encounters:  07/03/23 238 lb (108 kg)  01/23/23 237 lb (107.5 kg)  10/12/22 244 lb (110.7 kg)  07/13/22 248 lb (112.5 kg)  03/23/22 247 lb (112 kg)  12/22/21 252 lb (114.3 kg)  06/23/21 248 lb (112.5 kg)  01/26/21 242 lb 1.6 oz (109.8 kg)  12/28/20 250 lb (113.4 kg)  11/25/20 258 lb (117 kg)   HTN -prescribed losartan  100 mg daily.  He denies dizziness, lightheadedness, blurred vision, or headaches. BP Readings from Last 3 Encounters:  07/03/23 130/80  01/23/23 130/84  10/12/22 120/80   Hyperlipidemia-takes Lipitor 10 mg daily.  He denies myalgia or fatigue Lab Results  Component Value Date   CHOL 143 07/13/2022   HDL 46.20 07/13/2022   LDLCALC 74 07/13/2022   LDLDIRECT 129.2 12/18/2013   TRIG 114.0 07/13/2022   CHOLHDL 3 07/13/2022   Gout -takes allopurinol  300 mg daily.  He denies any recent gout flares  BPH - gets up once a night to urinate.   All immunizations and health maintenance protocols were reviewed with the patient and needed orders were placed. Refused vaccinations   Appropriate screening laboratory values were ordered for the patient including screening of  hyperlipidemia, renal function and hepatic function. If indicated by BPH, a PSA was ordered.  Medication reconciliation,  past medical history, social history, problem list and allergies were reviewed in detail with the patient  Goals were established with regard to weight loss, exercise, and  diet in compliance with medications  He is up to date on routine colon cancer screening    Review of Systems  Constitutional: Negative.   HENT: Negative.    Eyes: Negative.   Respiratory: Negative.    Cardiovascular: Negative.   Gastrointestinal: Negative.   Endocrine: Negative.   Genitourinary: Negative.   Musculoskeletal: Negative.   Skin: Negative.   Allergic/Immunologic: Negative.   Neurological: Negative.   Hematological: Negative.   Psychiatric/Behavioral: Negative.    All other systems reviewed and are negative.  Past Medical History:  Diagnosis Date   DM (diabetes mellitus) (HCC)    Gout    Hemorrhoids    Hyperlipidemia    Hypertension     Social History   Socioeconomic History   Marital status: Single    Spouse name: Not on file   Number of children: Not on file   Years of education: Not on file   Highest education level: Not on file  Occupational History   Not on file  Tobacco Use   Smoking status: Never   Smokeless tobacco: Never  Substance and Sexual Activity   Alcohol use: No   Drug use: No  Sexual activity: Not on file  Other Topics Concern   Not on file  Social History Narrative   Married   Two step kids   7 grandchildren    2 great grand kids         Social Drivers of Corporate investment banker Strain: Not on file  Food Insecurity: Not on file  Transportation Needs: Not on file  Physical Activity: Not on file  Stress: Not on file  Social Connections: Unknown (07/05/2021)   Received from [facility], [facility]   Social Network    Social Network: Not on file  Intimate Partner Violence: Unknown (05/26/2021)   Received from [facility], [facility]   HITS    Physically Hurt: Not on file    Insult or Talk Down To: Not on file    Threaten Physical Harm: Not on file    Scream or Curse: Not on file    Past Surgical History:  Procedure Laterality Date   HERNIA REPAIR     HIP ARTHRODESIS W/ ILIAC CREST BONE GRAFT     KNEE ARTHROPLASTY Left     Family History  Problem Relation Age of Onset   Diabetes Mother    Kidney failure Mother    Hyperlipidemia Father    Pancreatic cancer Father     No Known Allergies  Current Outpatient Medications on File Prior to Visit  Medication Sig Dispense Refill   allopurinol  (ZYLOPRIM ) 300 MG tablet Take 1 tablet by mouth once daily 90 tablet 0   atorvastatin  (LIPITOR) 10 MG tablet Take 1 tablet (10 mg total) by mouth daily. 90 tablet 3   losartan  (COZAAR ) 100 MG tablet Take 1 tablet by mouth once daily 90 tablet 0   metFORMIN  (GLUCOPHAGE ) 500 MG tablet Take 1 tablet (500 mg total) by mouth 2 (two) times daily with a meal. 180 tablet 1   No current facility-administered medications on file prior to visit.    BP 130/80   Pulse 64   Temp 98.3 F (36.8 C) (Oral)   Ht 5' 7.5" (1.715 m)   Wt 238 lb (108 kg)   SpO2 97%   BMI 36.73 kg/m        Objective:   Physical Exam Vitals and nursing note reviewed.  Constitutional:      General: He is not in acute distress.    Appearance: Normal appearance. He is obese. He is not ill-appearing.  HENT:     Head: Normocephalic and atraumatic.     Right Ear: Tympanic membrane, ear canal and external ear normal. There is no impacted cerumen.     Left Ear: Tympanic membrane, ear canal and external ear normal. There is no impacted cerumen.     Nose: Nose normal. No congestion or rhinorrhea.     Mouth/Throat:     Mouth: Mucous membranes are moist.     Pharynx: Oropharynx is clear.  Eyes:     Extraocular Movements: Extraocular movements intact.     Conjunctiva/sclera: Conjunctivae normal.     Pupils: Pupils are equal, round,  and reactive to light.  Neck:     Vascular: No carotid bruit.  Cardiovascular:     Rate and Rhythm: Normal rate and regular rhythm.     Pulses: Normal pulses.     Heart sounds: No murmur heard.    No friction rub. No gallop.  Pulmonary:     Effort: Pulmonary effort is normal.     Breath sounds: Normal breath sounds.  Abdominal:     General: Abdomen is flat. Bowel sounds are normal. There is no distension.     Palpations: Abdomen is soft. There is no mass.     Tenderness: There is no abdominal tenderness. There is no guarding or rebound.     Hernia: No hernia is present.  Musculoskeletal:        General: Normal range of motion.     Cervical back: Normal range of motion and neck supple.  Lymphadenopathy:     Cervical: No cervical adenopathy.  Skin:    General: Skin is warm and dry.     Capillary Refill: Capillary refill takes less than 2 seconds.  Neurological:     General: No focal deficit present.     Mental Status: He is alert and oriented to person, place, and time.  Psychiatric:        Mood and Affect: Mood normal.        Behavior: Behavior normal.        Thought Content: Thought content normal.        Judgment: Judgment normal.       Assessment & Plan:  1. Routine general medical examination at a health care facility (Primary) Today patient counseled on age appropriate routine health concerns for screening and prevention, each reviewed and up to date or declined. Immunizations reviewed and up to date or declined. Labs ordered and reviewed. Risk factors for depression reviewed and negative. Hearing function and visual acuity are intact. ADLs screened and addressed as needed. Functional ability and level of safety reviewed and appropriate. Education, counseling and referrals performed based on assessed risks today. Patient provided with a copy of personalized plan for preventive services.   2. Diabetes mellitus treated with oral medication (HCC) - Encouraged to take  Metformin  BID  - Follow up in 3-6 months depending on A1c  - Lipid panel; Future - TSH; Future - CBC; Future - Comprehensive metabolic panel with GFR; Future - Hemoglobin A1c; Future - Microalbumin/Creatinine Ratio, Urine; Future  3. Essential hypertension - Well controlled. No change in medication  - Lipid panel; Future - TSH; Future - CBC; Future - Comprehensive metabolic panel with GFR; Future  4. Mixed hyperlipidemia - Consider increase in statin  - Lipid panel; Future - TSH; Future - CBC; Future - Comprehensive metabolic panel with GFR; Future  5. BMI 36.0-36.9,adult - encouraged to keep working on weight loss through diet and exercise - Lipid panel; Future - TSH; Future - CBC; Future - Comprehensive metabolic panel with GFR; Future  6. Benign prostatic hyperplasia with nocturia  - PSA; Future  Alto Atta, NP

## 2023-07-03 NOTE — Patient Instructions (Addendum)
 It was great seeing you today   We will follow up with you regarding your lab work   Please let me know if you need anything

## 2023-07-04 ENCOUNTER — Ambulatory Visit: Payer: Self-pay | Admitting: Adult Health

## 2023-07-04 LAB — TSH: TSH: 3.68 u[IU]/mL (ref 0.35–5.50)

## 2023-07-04 LAB — HEMOGLOBIN A1C: Hgb A1c MFr Bld: 7.2 % — ABNORMAL HIGH (ref 4.6–6.5)

## 2023-07-04 LAB — LIPID PANEL
Cholesterol: 131 mg/dL (ref 0–200)
HDL: 42.1 mg/dL (ref >39.00)
LDL Cholesterol: 72 mg/dL (ref 0–99)
NonHDL: 88.95
Total CHOL/HDL Ratio: 3
Triglycerides: 86 mg/dL (ref 0.0–149.0)
VLDL: 17.2 mg/dL (ref 0.0–40.0)

## 2023-07-04 LAB — MICROALBUMIN / CREATININE URINE RATIO
Creatinine,U: 165.6 mg/dL
Microalb Creat Ratio: 4.3 mg/g (ref 0.0–30.0)
Microalb, Ur: 0.7 mg/dL (ref 0.0–1.9)

## 2023-07-04 LAB — COMPREHENSIVE METABOLIC PANEL WITH GFR
ALT: 17 U/L (ref 0–53)
AST: 14 U/L (ref 0–37)
Albumin: 4.4 g/dL (ref 3.5–5.2)
Alkaline Phosphatase: 83 U/L (ref 39–117)
BUN: 19 mg/dL (ref 6–23)
CO2: 28 meq/L (ref 19–32)
Calcium: 9.3 mg/dL (ref 8.4–10.5)
Chloride: 104 meq/L (ref 96–112)
Creatinine, Ser: 0.89 mg/dL (ref 0.40–1.50)
GFR: 92.1 mL/min (ref >60.00)
Glucose, Bld: 102 mg/dL — ABNORMAL HIGH (ref 70–99)
Potassium: 4.5 meq/L (ref 3.5–5.1)
Sodium: 141 meq/L (ref 135–145)
Total Bilirubin: 0.8 mg/dL (ref 0.2–1.2)
Total Protein: 6.8 g/dL (ref 6.0–8.3)

## 2023-07-04 LAB — CBC
HCT: 41.4 % (ref 39.0–52.0)
Hemoglobin: 13.8 g/dL (ref 13.0–17.0)
MCHC: 33.4 g/dL (ref 30.0–36.0)
MCV: 91 fl (ref 78.0–100.0)
Platelets: 196 10*3/uL (ref 150.0–400.0)
RBC: 4.55 Mil/uL (ref 4.22–5.81)
RDW: 13.7 % (ref 11.5–15.5)
WBC: 7.9 10*3/uL (ref 4.0–10.5)

## 2023-07-04 LAB — PSA: PSA: 0.19 ng/mL (ref 0.10–4.00)

## 2023-07-27 ENCOUNTER — Other Ambulatory Visit: Payer: Self-pay | Admitting: Adult Health

## 2023-07-27 DIAGNOSIS — E119 Type 2 diabetes mellitus without complications: Secondary | ICD-10-CM

## 2023-08-21 ENCOUNTER — Other Ambulatory Visit: Payer: Self-pay | Admitting: Adult Health

## 2023-08-21 DIAGNOSIS — I10 Essential (primary) hypertension: Secondary | ICD-10-CM

## 2023-09-18 ENCOUNTER — Other Ambulatory Visit: Payer: Self-pay | Admitting: Adult Health

## 2023-09-18 DIAGNOSIS — M10071 Idiopathic gout, right ankle and foot: Secondary | ICD-10-CM

## 2023-10-22 ENCOUNTER — Other Ambulatory Visit: Payer: Self-pay | Admitting: Adult Health

## 2023-10-22 DIAGNOSIS — E119 Type 2 diabetes mellitus without complications: Secondary | ICD-10-CM

## 2023-11-13 ENCOUNTER — Other Ambulatory Visit: Payer: Self-pay | Admitting: Adult Health

## 2023-11-13 DIAGNOSIS — I10 Essential (primary) hypertension: Secondary | ICD-10-CM

## 2024-01-15 ENCOUNTER — Other Ambulatory Visit: Payer: Self-pay | Admitting: Adult Health

## 2024-01-15 DIAGNOSIS — E119 Type 2 diabetes mellitus without complications: Secondary | ICD-10-CM

## 2024-01-15 DIAGNOSIS — M10071 Idiopathic gout, right ankle and foot: Secondary | ICD-10-CM

## 2024-02-04 ENCOUNTER — Other Ambulatory Visit: Payer: Self-pay | Admitting: Adult Health

## 2024-02-04 DIAGNOSIS — I10 Essential (primary) hypertension: Secondary | ICD-10-CM
